# Patient Record
Sex: Male | Born: 1970
Health system: Southern US, Community
[De-identification: ages and names within clinical notes are randomized; demographics above are authoritative.]

## PROBLEM LIST (undated history)

## (undated) DIAGNOSIS — I82409 Acute embolism and thrombosis of unspecified deep veins of unspecified lower extremity: Secondary | ICD-10-CM

## (undated) DIAGNOSIS — R0602 Shortness of breath: Secondary | ICD-10-CM

## (undated) HISTORY — DX: Shortness of breath: R06.02

## (undated) HISTORY — PX: NO PAST SURGERIES: SHX2092

---

## 2000-11-07 ENCOUNTER — Encounter: Admission: RE | Admit: 2000-11-07 | Discharge: 2000-11-07 | Payer: Self-pay | Admitting: General Practice

## 2000-11-07 ENCOUNTER — Encounter: Payer: Self-pay | Admitting: General Practice

## 2009-12-27 ENCOUNTER — Emergency Department (HOSPITAL_COMMUNITY): Admission: EM | Admit: 2009-12-27 | Discharge: 2009-12-27 | Payer: Self-pay | Admitting: Emergency Medicine

## 2010-01-03 ENCOUNTER — Ambulatory Visit: Payer: Self-pay | Admitting: Oncology

## 2010-01-06 LAB — CBC WITH DIFFERENTIAL/PLATELET
BASO%: 0.6 % (ref 0.0–2.0)
Basophils Absolute: 0 10*3/uL (ref 0.0–0.1)
EOS%: 4.4 % (ref 0.0–7.0)
Eosinophils Absolute: 0.2 10*3/uL (ref 0.0–0.5)
HCT: 42.1 % (ref 38.4–49.9)
HGB: 14.1 g/dL (ref 13.0–17.1)
LYMPH%: 44.4 % (ref 14.0–49.0)
MCH: 27.5 pg (ref 27.2–33.4)
MCHC: 33.5 g/dL (ref 32.0–36.0)
MCV: 82.3 fL (ref 79.3–98.0)
MONO#: 0.6 10*3/uL (ref 0.1–0.9)
MONO%: 9.8 % (ref 0.0–14.0)
NEUT#: 2.3 10*3/uL (ref 1.5–6.5)
NEUT%: 40.8 % (ref 39.0–75.0)
Platelets: 231 10*3/uL (ref 140–400)
RBC: 5.11 10*6/uL (ref 4.20–5.82)
RDW: 12.8 % (ref 11.0–14.6)
WBC: 5.7 10*3/uL (ref 4.0–10.3)
lymph#: 2.5 10*3/uL (ref 0.9–3.3)

## 2010-01-08 LAB — LUPUS ANTICOAGULANT PANEL
DRVVT 1:1 Mix: 42.3 secs (ref 36.2–44.3)
DRVVT: 55.9 secs — ABNORMAL HIGH (ref 36.2–44.3)
Lupus Anticoagulant: NOT DETECTED
PTT Lupus Anticoagulant: 58.8 secs — ABNORMAL HIGH (ref 32.0–43.4)
PTTLA 4:1 Mix: 47.9 secs (ref 36.3–48.8)
PTTLA Confirmation: 0.7 secs (ref <8.0)

## 2010-01-08 LAB — PROTHROMBIN TIME
INR: 1.83 — ABNORMAL HIGH (ref <1.50)
Prothrombin Time: 21 seconds — ABNORMAL HIGH (ref 11.6–15.2)

## 2010-01-08 LAB — COMPREHENSIVE METABOLIC PANEL
ALT: 59 U/L — ABNORMAL HIGH (ref 0–53)
AST: 38 U/L — ABNORMAL HIGH (ref 0–37)
Albumin: 4.3 g/dL (ref 3.5–5.2)
Alkaline Phosphatase: 52 U/L (ref 39–117)
BUN: 16 mg/dL (ref 6–23)
CO2: 23 mEq/L (ref 19–32)
Calcium: 9.2 mg/dL (ref 8.4–10.5)
Chloride: 103 mEq/L (ref 96–112)
Creatinine, Ser: 1.08 mg/dL (ref 0.40–1.50)
Glucose, Bld: 87 mg/dL (ref 70–99)
Potassium: 3.9 mEq/L (ref 3.5–5.3)
Sodium: 137 mEq/L (ref 135–145)
Total Bilirubin: 0.3 mg/dL (ref 0.3–1.2)
Total Protein: 7.1 g/dL (ref 6.0–8.3)

## 2010-01-08 LAB — PROTHROMBIN GENE MUTATION

## 2010-01-08 LAB — D-DIMER, QUANTITATIVE: D-Dimer, Quant: 1.48 ug/mL-FEU — ABNORMAL HIGH (ref 0.00–0.48)

## 2010-01-08 LAB — FACTOR 5 LEIDEN

## 2010-01-08 LAB — LACTATE DEHYDROGENASE: LDH: 179 U/L (ref 94–250)

## 2010-03-10 ENCOUNTER — Ambulatory Visit: Payer: Self-pay | Admitting: Oncology

## 2010-03-14 LAB — SEDIMENTATION RATE: Sed Rate: 8 mm/hr (ref 0–16)

## 2010-03-14 LAB — PROTIME-INR
INR: 2 (ref 2.00–3.50)
Protime: 24 Seconds — ABNORMAL HIGH (ref 10.6–13.4)

## 2010-07-08 ENCOUNTER — Ambulatory Visit: Payer: Self-pay | Admitting: Oncology

## 2010-07-12 LAB — CBC WITH DIFFERENTIAL/PLATELET
BASO%: 0.5 % (ref 0.0–2.0)
EOS%: 3.9 % (ref 0.0–7.0)
Eosinophils Absolute: 0.2 10*3/uL (ref 0.0–0.5)
MCH: 26.5 pg — ABNORMAL LOW (ref 27.2–33.4)
MCHC: 33.1 g/dL (ref 32.0–36.0)
MCV: 80.1 fL (ref 79.3–98.0)
MONO%: 7.4 % (ref 0.0–14.0)
NEUT#: 2.7 10*3/uL (ref 1.5–6.5)
RBC: 5.32 10*6/uL (ref 4.20–5.82)
RDW: 13.6 % (ref 11.0–14.6)
nRBC: 0 % (ref 0–0)

## 2010-07-13 LAB — COMPREHENSIVE METABOLIC PANEL
ALT: 24 U/L (ref 0–53)
AST: 16 U/L (ref 0–37)
Alkaline Phosphatase: 58 U/L (ref 39–117)
Creatinine, Ser: 0.94 mg/dL (ref 0.40–1.50)
Sodium: 141 mEq/L (ref 135–145)
Total Bilirubin: 0.3 mg/dL (ref 0.3–1.2)
Total Protein: 7 g/dL (ref 6.0–8.3)

## 2010-07-13 LAB — PROTHROMBIN TIME: INR: 2.17 — ABNORMAL HIGH (ref <1.50)

## 2010-09-07 ENCOUNTER — Ambulatory Visit: Payer: Self-pay | Admitting: Oncology

## 2010-09-09 ENCOUNTER — Ambulatory Visit
Admission: RE | Admit: 2010-09-09 | Discharge: 2010-09-09 | Payer: Self-pay | Source: Home / Self Care | Attending: Oncology | Admitting: Oncology

## 2010-10-07 ENCOUNTER — Other Ambulatory Visit (HOSPITAL_COMMUNITY): Payer: Self-pay | Admitting: Oncology

## 2010-10-07 ENCOUNTER — Encounter (HOSPITAL_BASED_OUTPATIENT_CLINIC_OR_DEPARTMENT_OTHER): Payer: PRIVATE HEALTH INSURANCE | Admitting: Oncology

## 2010-10-07 DIAGNOSIS — Z86718 Personal history of other venous thrombosis and embolism: Secondary | ICD-10-CM

## 2010-10-07 DIAGNOSIS — Z7901 Long term (current) use of anticoagulants: Secondary | ICD-10-CM

## 2010-10-12 LAB — PROTEIN S ACTIVITY: Protein S Activity: 97 % (ref 69–129)

## 2010-10-12 LAB — PROTEIN C ACTIVITY: Protein C Activity: 122 % (ref 75–133)

## 2010-10-12 LAB — PROTEIN S, ANTIGEN, FREE: Protein S Ag, Free: 84 % normal (ref 57–171)

## 2010-10-12 LAB — PROTEIN C, TOTAL: Protein C, Total: 83 % (ref 70–140)

## 2010-10-12 LAB — LUPUS ANTICOAGULANT PANEL
Lupus Anticoagulant: NOT DETECTED
PTT Lupus Anticoagulant: 31.1 secs (ref 30.0–45.6)

## 2010-10-12 LAB — ANTITHROMBIN III: AntiThromb III Func: 103 % (ref 76–126)

## 2011-02-27 ENCOUNTER — Encounter (HOSPITAL_BASED_OUTPATIENT_CLINIC_OR_DEPARTMENT_OTHER): Payer: PRIVATE HEALTH INSURANCE | Admitting: Oncology

## 2011-02-27 ENCOUNTER — Other Ambulatory Visit (HOSPITAL_COMMUNITY): Payer: Self-pay | Admitting: Oncology

## 2011-02-27 DIAGNOSIS — Z7901 Long term (current) use of anticoagulants: Secondary | ICD-10-CM

## 2011-02-27 DIAGNOSIS — Z86718 Personal history of other venous thrombosis and embolism: Secondary | ICD-10-CM

## 2011-02-28 LAB — D-DIMER, QUANTITATIVE: D-Dimer, Quant: 0.38 ug/mL-FEU (ref 0.00–0.48)

## 2012-01-29 ENCOUNTER — Telehealth: Payer: Self-pay

## 2012-01-29 NOTE — Telephone Encounter (Signed)
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 He quit last Monday 6/10 and came home to [facility]. He denies redness or swelling. He has hx of unprovoked DVT. He was last seen here on 02/27/11 with no further appts ordered. I told him to go to his PCP or urgent care today to get this checked out. He expressed understanding.

## 2012-03-02 ENCOUNTER — Emergency Department (HOSPITAL_COMMUNITY)
Admission: EM | Admit: 2012-03-02 | Discharge: 2012-03-02 | Disposition: A | Discharge status: Home / Self Care | Payer: Self-pay | Attending: Emergency Medicine | Admitting: Emergency Medicine

## 2012-03-02 ENCOUNTER — Encounter (HOSPITAL_COMMUNITY): Payer: Self-pay | Admitting: Emergency Medicine

## 2012-03-02 DIAGNOSIS — M79609 Pain in unspecified limb: Secondary | ICD-10-CM

## 2012-03-02 DIAGNOSIS — M79606 Pain in leg, unspecified: Secondary | ICD-10-CM

## 2012-03-02 DIAGNOSIS — Z86718 Personal history of other venous thrombosis and embolism: Secondary | ICD-10-CM | POA: Insufficient documentation

## 2012-03-02 DIAGNOSIS — M7989 Other specified soft tissue disorders: Secondary | ICD-10-CM

## 2012-03-02 HISTORY — DX: Acute embolism and thrombosis of unspecified deep veins of unspecified lower extremity: I82.409

## 2012-03-02 NOTE — Progress Notes (Signed)
VASCULAR LAB PRELIMINARY  PRELIMINARY  PRELIMINARY  PRELIMINARY  Bilateral lower extremity venous Dopplers completed.    Preliminary report:  There is no DVT or SVT noted in the bilateral lower extremities.  Starlina Lapre, 03/02/2012, 2:29 PM

## 2012-03-02 NOTE — ED Provider Notes (Signed)
History     CSN: [id number]  Arrival date & time 03/02/12  1143   First MD Initiated Contact with Patient 03/02/12 1229      12:43 PM HPI Pt reports for the last week has had intermittent calf pain. Reports h/o DVT and long road trips. Is not on anticoagulants any longer. Denies CP or SOB.   Patient is a 41 y.o. male presenting with leg pain.  Leg Pain  The incident occurred more than 1 week ago. The injury mechanism is unknown. The pain is present in the left leg. The quality of the pain is described as aching. The pain is moderate. The pain has been constant since onset. Pertinent negatives include no numbness, no inability to bear weight, no loss of motion, no muscle weakness, no loss of sensation and no tingling. He reports no foreign bodies present. The symptoms are aggravated by activity. He has tried rest for the symptoms. The treatment provided no relief.    Past Medical History  Diagnosis Date  . DVT (deep venous thrombosis)     History reviewed. No pertinent past surgical history.  History reviewed. No pertinent family history.  History  Substance Use Topics  . Smoking status: Never Smoker   . Smokeless tobacco: Not on file  . Alcohol Use: No      Review of Systems  Musculoskeletal:       Leg pain   Neurological: Negative for tingling and numbness.  All other systems reviewed and are negative.    Allergies  Aspirin  Home Medications   Current Outpatient Rx  Name Route Sig Dispense Refill  . ADULT MULTIVITAMIN W/MINERALS CH Oral Take 1 tablet by mouth daily.      BP 144/79  Pulse 66  Temp 98 F (36.7 C) (Oral)  Resp 18  SpO2 98%  Physical Exam  Constitutional: He is oriented to person, place, and time. He appears well-developed and well-nourished.  HENT:  Head: Normocephalic and atraumatic.  Eyes: Conjunctivae are normal. Pupils are equal, round, and reactive to light.  Neck: Normal range of motion. Neck supple.  Cardiovascular: Normal rate,  regular rhythm and normal heart sounds.   Pulmonary/Chest: Effort normal and breath sounds normal.  Abdominal: Soft. Bowel sounds are normal.  Musculoskeletal:       Normal exam of bilateral lower extremities. Normal range of motion, normal sensation, nontender to palpation, no masses records, normal distal pulses and capillary refill  Neurological: He is alert and oriented to person, place, and time.  Skin: Skin is warm and dry. No rash noted. No erythema. No pallor.  Psychiatric: He has a normal mood and affect. His behavior is normal. Thought content normal.    ED Course  Procedures    MDM   Dopplers were negative for DVT. Advised patient and muscle pain versus circulation pain. 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 Patient voices understanding and is ready for discharge    Thomasene Lot, Cordelia Poche 03/02/12 [id number]

## 2012-03-02 NOTE — ED Notes (Signed)
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  Pt states pain has not decreased over the past month.  Pt states pain has gotten to the point that it is hard to walk.  Pt has hx of DVT in the L leg in 2011, and was treated for 6 months.  Pt able to move legs, pedal pulse strong.

## 2012-03-02 NOTE — ED Notes (Signed)
Duplex completed

## 2012-03-02 NOTE — ED Provider Notes (Signed)
Medical screening examination/treatment/procedure(s) were performed by non-physician practitioner and as supervising physician I was immediately available for consultation/collaboration.  Derwood Kaplan, MD 03/02/12 1747

## 2014-06-28 DIAGNOSIS — I10 Essential (primary) hypertension: Secondary | ICD-10-CM | POA: Insufficient documentation

## 2016-02-11 ENCOUNTER — Emergency Department (HOSPITAL_COMMUNITY)
Admission: EM | Admit: 2016-02-11 | Discharge: 2016-02-11 | Disposition: A | Discharge status: Home / Self Care | Payer: 59 | Attending: Emergency Medicine | Admitting: Emergency Medicine

## 2016-02-11 ENCOUNTER — Encounter (HOSPITAL_COMMUNITY): Payer: Self-pay

## 2016-02-11 ENCOUNTER — Emergency Department (HOSPITAL_BASED_OUTPATIENT_CLINIC_OR_DEPARTMENT_OTHER)
Admit: 2016-02-11 | Discharge: 2016-02-11 | Disposition: A | Discharge status: Home / Self Care | Payer: 59 | Attending: Emergency Medicine | Admitting: Emergency Medicine

## 2016-02-11 ENCOUNTER — Telehealth: Payer: Self-pay | Admitting: *Deleted

## 2016-02-11 ENCOUNTER — Telehealth (HOSPITAL_COMMUNITY): Payer: Self-pay

## 2016-02-11 DIAGNOSIS — Z79899 Other long term (current) drug therapy: Secondary | ICD-10-CM | POA: Diagnosis not present

## 2016-02-11 DIAGNOSIS — M79661 Pain in right lower leg: Secondary | ICD-10-CM | POA: Diagnosis present

## 2016-02-11 DIAGNOSIS — I82401 Acute embolism and thrombosis of unspecified deep veins of right lower extremity: Secondary | ICD-10-CM | POA: Insufficient documentation

## 2016-02-11 DIAGNOSIS — M79609 Pain in unspecified limb: Secondary | ICD-10-CM

## 2016-02-11 LAB — BASIC METABOLIC PANEL
Anion gap: 7 (ref 5–15)
BUN: 11 mg/dL (ref 6–20)
CALCIUM: 9.1 mg/dL (ref 8.9–10.3)
CO2: 26 mmol/L (ref 22–32)
CREATININE: 0.94 mg/dL (ref 0.61–1.24)
Chloride: 105 mmol/L (ref 101–111)
GFR calc Af Amer: >60 mL/min (ref >60)
Glucose, Bld: 113 mg/dL — ABNORMAL HIGH (ref 65–99)
POTASSIUM: 4 mmol/L (ref 3.5–5.1)
SODIUM: 138 mmol/L (ref 135–145)

## 2016-02-11 MED ORDER — RIVAROXABAN 15 MG PO TABS
15.0000 mg | ORAL_TABLET | Freq: Once | ORAL | Status: AC
  Administered 2016-02-11: 15 mg via ORAL
  Filled 2016-02-11: qty 1

## 2016-02-11 MED ORDER — RIVAROXABAN (XARELTO) VTE STARTER PACK (15 & 20 MG): 15.0000 mg | ORAL_TABLET | Freq: Two times a day (BID) | ORAL | Status: DC

## 2016-02-11 MED ORDER — RIVAROXABAN (XARELTO) EDUCATION KIT FOR DVT/PE PATIENTS
PACK | Freq: Once | Status: AC
Start: 2016-02-11 — End: 2016-02-11
  Administered 2016-02-11: 13:00:00
  Filled 2016-02-11: qty 1

## 2016-02-11 NOTE — Progress Notes (Signed)
VASCULAR LAB PRELIMINARY  PRELIMINARY  PRELIMINARY  PRELIMINARY  Bilateral lower extremity venous duplex completed.    Preliminary report:  Bilateral exam completed per protocol in patient with acute DVT noted in the requested extremity. Positive for acute DVT of the right gastrocnemius vein coursing from the mid calf to the confluence with but not including the popliteal vein. No evidence of a superficial thrombosis or Baker's cyst involving the right lower extremity. Left:  No evidence of DVT, superficial thrombosis, or Baker's cyst.  Magnum Lunde, RVS 02/11/2016, 10:35 AM

## 2016-02-11 NOTE — Discharge Instructions (Signed)
Return without fail for worsening symptoms, including worsening swelling or pain, chest pain, difficulty breathing, passing out, or any other symptoms concerning to you.  Deep Vein Thrombosis A deep vein thrombosis (DVT) is a blood clot (thrombus) that usually occurs in a deep, larger vein of the lower leg or the pelvis, or in an upper extremity such as the arm. These are dangerous and can lead to serious and even life-threatening complications if the clot travels to the lungs. A DVT can damage the valves in your leg veins so that instead of flowing upward, the blood pools in the lower leg. This is called post-thrombotic syndrome, and it can result in pain, swelling, discoloration, and sores on the leg. CAUSES A DVT is caused by the formation of a blood clot in your leg, pelvis, or arm. Usually, several things contribute to the formation of blood clots. A clot may develop when:  Your blood flow slows down.  Your vein becomes damaged in some way.  You have a condition that makes your blood clot more easily. RISK FACTORS A DVT is more likely to develop in:  People who are older, especially over 67 years of age.  People who are overweight (obese).  People who sit or lie still for a long time, such as during long-distance travel (over 4 hours), bed rest, hospitalization, or during recovery from certain medical conditions like a stroke.  People who do not engage in much physical activity (sedentary lifestyle).  People who have chronic breathing disorders.  People who have a personal or family history of blood clots or blood clotting disease.  People who have peripheral vascular disease (PVD), diabetes, or some types of cancer.  People who have heart disease, especially if the person had a recent heart attack or has congestive heart failure.  People who have neurological diseases that affect the legs (leg paresis).  People who have had a traumatic injury, such as breaking a hip or  leg.  People who have recently had major or lengthy surgery, especially on the hip, knee, or abdomen.  People who have had a central line placed inside a large vein.  People who take medicines that contain the hormone estrogen. These include birth control pills and hormone replacement therapy.  Pregnancy or during childbirth or the postpartum period.  Long plane flights (over 8 hours). SIGNS AND SYMPTOMS Symptoms of a DVT can include:   Swelling of your leg or arm, especially if one side is much worse.  Warmth and redness of your leg or arm, especially if one side is much worse.  Pain in your arm or leg. If the clot is in your leg, symptoms may be more noticeable or worse when you stand or walk.  A feeling of pins and needles, if the clot is in the arm. The symptoms of a DVT that has traveled to the lungs (pulmonary embolism, PE) usually start suddenly and include:  Shortness of breath while active or at rest.  Coughing or coughing up blood or blood-tinged mucus.  Chest pain that is often worse with deep breaths.  Rapid or irregular heartbeat.  Feeling light-headed or dizzy.  Fainting.  Feeling anxious.  Sweating. There may also be pain and swelling in a leg if that is where the blood clot started. These symptoms may represent a serious problem that is an emergency. Do not wait to see if the symptoms will go away. Get medical help right away. Call your local emergency services (911 in the [place]  Do not drive yourself to the hospital. DIAGNOSIS Your health care provider will take a medical history and perform a physical exam. You may also have other tests, including:  Blood tests to assess the clotting properties of your blood.  Imaging tests, such as CT, ultrasound, MRI, X-ray, and other tests to see if you have clots anywhere in your body. TREATMENT After a DVT is identified, it can be treated. The type of treatment that you receive depends on many factors, such as the  cause of your DVT, your risk for bleeding or developing more clots, and other medical conditions that you have. Sometimes, a combination of treatments is necessary. Treatment options may be combined and include:  Monitoring the blood clot with ultrasound.  Taking medicines by mouth, such as newer blood thinners (anticoagulants), thrombolytics, or warfarin.  Taking anticoagulant medicine by injection or through an IV tube.  Wearing compression stockings or using different types ofdevices.  Surgery (rare) to remove the blood clot or to place a filter in your abdomen to stop the blood clot from traveling to your lungs. Treatments for a DVT are often divided into immediate treatment and long-term treatment (up to 3 months after DVT). You can work with your health care provider to choose the treatment program that is best for you. HOME CARE INSTRUCTIONS If you are taking a newer oral anticoagulant:  Take the medicine every single day at the same time each day.  Understand what foods and drugs interact with this medicine.  Understand that there are no regular blood tests required when using this medicine.  Understand the side effects of this medicine, including excessive bruising or bleeding. Ask your health care provider or pharmacist about other possible side effects. If you are taking warfarin:  Understand how to take warfarin and know which foods can affect how warfarin works in Veterinary surgeon.  Understand that it is dangerous to take too much or too little warfarin. Too much warfarin increases the risk of bleeding. Too little warfarin continues to allow the risk for blood clots.  Follow your PT and INR blood testing schedule. The PT and INR results allow your health care provider to adjust your dose of warfarin. It is very important that you have your PT and INR tested as often as told by your health care provider.  Avoid major changes in your diet, or tell your health care provider before  you change your diet. Arrange a visit with a registered dietitian to answer your questions. Many foods, especially foods that are high in vitamin K, can interfere with warfarin and affect the PT and INR results. Eat a consistent amount of foods that are high in vitamin K, such as:  Spinach, kale, broccoli, cabbage, collard greens, turnip greens, Brussels sprouts, peas, cauliflower, seaweed, and parsley.  Beef liver and pork liver.  Green tea.  Soybean oil.  Tell your health care provider about any and all medicines, vitamins, and supplements that you take, including aspirin and other over-the-counter anti-inflammatory medicines. Be especially cautious with aspirin and anti-inflammatory medicines. Do not take those before you ask your health care provider if it is safe to do so. This is important because many medicines can interfere with warfarin and affect the PT and INR results.  Do not start or stop taking any over-the-counter or prescription medicine unless your health care provider or pharmacist tells you to do so. If you take warfarin, you will also need to do these things:  Hold pressure over cuts  for longer than usual.  Tell your dentist and other health care providers that you are taking warfarin before you have any procedures in which bleeding may occur.  Avoid alcohol or drink very small amounts. Tell your health care provider if you change your alcohol intake.  Do not use tobacco products, including cigarettes, chewing tobacco, and e-cigarettes. If you need help quitting, ask your health care provider.  Avoid contact sports. General Instructions  Take over-the-counter and prescription medicines only as told by your health care provider. Anticoagulant medicines can have side effects, including easy bruising and difficulty stopping bleeding. If you are prescribed an anticoagulant, you will also need to do these things:  Hold pressure over cuts for longer than usual.  Tell your  dentist and other health care providers that you are taking anticoagulants before you have any procedures in which bleeding may occur.  Avoid contact sports.  Wear a medical alert bracelet or carry a medical alert card that says you have had a PE.  Ask your health care provider how soon you can go back to your normal activities. Stay active to prevent new blood clots from forming.  Make sure to exercise while traveling or when you have been sitting or standing for a long period of time. It is very important to exercise. Exercise your legs by walking or by tightening and relaxing your leg muscles often. Take frequent walks.  Wear compression stockings as told by your health care provider to help prevent more blood clots from forming.  Do not use tobacco products, including cigarettes, chewing tobacco, and e-cigarettes. If you need help quitting, ask your health care provider.  Keep all follow-up appointments with your health care provider. This is important. PREVENTION Take these actions to decrease your risk of developing another DVT:  Exercise regularly. For at least 30 minutes every day, engage in:  Activity that involves moving your arms and legs.  Activity that encourages good blood flow through your body by increasing your heart rate.  Exercise your arms and legs every hour during long-distance travel (over 4 hours). Drink plenty of water and avoid drinking alcohol while traveling.  Avoid sitting or lying in bed for long periods of time without moving your legs.  Maintain a weight that is appropriate for your height. Ask your health care provider what weight is healthy for you.  If you are a woman who is over 45 years of age, avoid unnecessary use of medicines that contain estrogen. These include birth control pills.  Do not smoke, especially if you take estrogen medicines. If you need help quitting, ask your health care provider. If you are hospitalized, prevention measures may  include:  Early walking after surgery, as soon as your health care provider says that it is safe.  Receiving anticoagulants to prevent blood clots.If you cannot take anticoagulants, other options may be available, such as wearing compression stockings or using different types of devices. SEEK IMMEDIATE MEDICAL CARE IF:  You have new or increased pain, swelling, or redness in an arm or leg.  You have numbness or tingling in an arm or leg.  You have shortness of breath while active or at rest.  You have chest pain.  You have a rapid or irregular heartbeat.  You feel light-headed or dizzy.  You cough up blood.  You notice blood in your vomit, bowel movement, or urine. These symptoms may represent a serious problem that is an emergency. Do not wait to see if the symptoms  will go away. Get medical help right away. Call your local emergency services (911 in the [place] Do not drive yourself to the hospital.   This information is not intended to replace advice given to you by your health care provider. Make sure you discuss any questions you have with your health care provider.   Document Released: 07/31/2005 Document Revised: 04/21/2015 Document Reviewed: 11/25/2014 Elsevier Interactive Patient Education 2016 [place] on my medicine - XARELTO (rivaroxaban)  This medication education was reviewed with me or my healthcare representative as part of my discharge preparation.  The pharmacist that spoke with me during my hospital stay was:  Cadey Bazile A, [place]? Xarelto was prescribed to treat blood clots that may have been found in the veins of your legs (deep vein thrombosis) or in your lungs (pulmonary embolism) and to reduce the risk of them occurring again.  What do you need to know about Xarelto? The starting dose is one 15 mg tablet taken TWICE daily with food for the FIRST 21 DAYS then on (enter date)  03/03/2016  the dose is  changed to one 20 mg tablet taken ONCE A DAY with your evening meal.  DO NOT stop taking Xarelto without talking to the health care provider who prescribed the medication.  Refill your prescription for 20 mg tablets before you run out.  After discharge, you should have regular check-up appointments with your healthcare provider that is prescribing your Xarelto.  In the future your dose may need to be changed if your kidney function changes by a significant amount.  What do you do if you miss a dose? If you are taking Xarelto TWICE DAILY and you miss a dose, take it as soon as you remember. You may take two 15 mg tablets (total 30 mg) at the same time then resume your regularly scheduled 15 mg twice daily the next day.  If you are taking Xarelto ONCE DAILY and you miss a dose, take it as soon as you remember on the same day then continue your regularly scheduled once daily regimen the next day. Do not take two doses of Xarelto at the same time.   Important Safety Information Xarelto is a blood thinner medicine that can cause bleeding. You should call your healthcare provider right away if you experience any of the following: ? Bleeding from an injury or your nose that does not stop. ? Unusual colored urine (red or dark brown) or unusual colored stools (red or black). ? Unusual bruising for unknown reasons. ? A serious fall or if you hit your head (even if there is no bleeding).  Some medicines may interact with Xarelto and might increase your risk of bleeding while on Xarelto. To help avoid this, consult your healthcare provider or pharmacist prior to using any new prescription or non-prescription medications, including herbals, vitamins, non-steroidal anti-inflammatory drugs (NSAIDs) and supplements.  This website has more information on Xarelto: [email or web address]

## 2016-02-11 NOTE — ED Provider Notes (Signed)
CSN: [id number]     Arrival date & time 02/11/16  0755 History   First MD Initiated Contact with Patient 02/11/16 [phone]     Chief Complaint  Patient presents with  . Leg Pain     (Consider location/radiation/quality/duration/timing/severity/associated sxs/prior Treatment) HPI 45 year old male who presents with right leg pain. He has a history of DVT in 2011, no longer on anticoagulation. Develop his DVT in the setting of immobilization. States that last week he drove to and from [place], stopping frequently along the way to walk around. Since return from [place] over the past 4 days he is noted right calf pain, posterior knee pain, and posterior thigh pain. States that he initially noticed this after walking on the treadmill. He did not have any fall or trauma. Denies any associating numbness or weakness. Does not appreciate swelling or skin color changes of his lower extremity. No chest pain or difficulty breathing. Past Medical History  Diagnosis Date  . DVT (deep venous thrombosis) ([place])    History reviewed. No pertinent past surgical history. History reviewed. No pertinent family history. Social History  Substance Use Topics  . Smoking status: Never Smoker   . Smokeless tobacco: None  . Alcohol Use: Yes     Comment: occ    Review of Systems 10/14 systems reviewed and are negative other than those stated in the HPI    Allergies  Aspirin  Home Medications   Prior to Admission medications   Medication Sig Start Date End Date Taking? Authorizing Provider  Multiple Vitamin (MULTIVITAMIN WITH MINERALS) TABS Take 1 tablet by mouth daily.   Yes Historical Provider, MD  Omega-3 Fatty Acids (FISH OIL) 1000 MG CAPS Take 1,000 mg by mouth daily.   Yes Historical Provider, MD  Rivaroxaban 15 & 20 MG TBPK Take 15 mg by mouth 2 (two) times daily. Take as directed on package: Start with one 15mg  tablet by mouth twice a day with food. On Day 22, switch to one 20mg  tablet once a day with  food. 02/11/16   Forde Dandy, MD   BP 133/75 mmHg  Pulse 67  Temp(Src) 98.1 F (36.7 C) (Oral)  Resp 16  SpO2 99% Physical Exam Physical Exam  Nursing note and vitals reviewed. Constitutional: Well developed, well nourished, non-toxic, and in no acute distress Head: Normocephalic and atraumatic.  Mouth/Throat: Oropharynx is clear and moist.  Neck: Normal range of motion. Neck supple.  Cardiovascular: +2 DP pulses bilaterally Pulmonary/Chest: Effort normal and breath sounds normal.  Abdominal: Soft. There is no tenderness. There is no rebound and no guarding.  Musculoskeletal: Normal range of motion. No edema in lower extremities. No deformities. No overlying skin changes. There is mild tenderness to palpation of the posterior right thigh and posterior calf.  Neurological: Alert, no facial droop, fluent speech, full strength in bilateral lower extremities, sensation to light touch in tact throughout bilateral lower extremities Skin: Skin is warm and dry.  Psychiatric: Cooperative  ED Course  Procedures (including critical care time) Labs Review Labs Reviewed  BASIC METABOLIC PANEL - Abnormal; Notable for the following:    Glucose, Bld 113 (*)    All other components within normal limits    Imaging Review No results found. I have personally reviewed and evaluated these images and lab results as part of my medical decision-making.   EKG Interpretation None      MDM   Final diagnoses:  DVT (deep venous thrombosis), right    With right calf tenderness over  past 4 days. [place] LE with acute gastrocnemius DVT without extension into proximal veins. Would be eligible for repeat US rather than anticoagulation given distal DVT but he is without PCP and there is concern for loss to follow-up and he is symptomatic from DVT. No concern for PE. I felt that initiating Xarelto would be best option for him at this time while he is looking for a primary care provider to follow-up with. No  further extension his primary care doctor on follow up may discontinue anticoagulation is needed. He has normal renal function, given first dose here and teaching provided by pharmacy. Strict return and follow-up instructions are reviewed. He expressed understanding of discharge instructions, and felt comfortable with the plan of care.    Forde Dandy, MD 02/11/16 [phone]

## 2016-02-11 NOTE — Telephone Encounter (Signed)
Call from pharmacy pts insurance not covering [place] call transferred to prescriber Dr Tyrone Schimke for assistance

## 2016-02-11 NOTE — ED Notes (Addendum)
Pt c/o R calf pain radiating into R posterior thigh x 4 days.  Pain score 7/10.  Pt has not been taking anything for pain.  Denies injury.  Pt reports that he was doing "nothing special" when pain began.  Pt reports being seen by an Urgent care yesterday, but was not given a diagnosis.   Hx of DVT in L leg.  No swelling, warmth, or discoloration noted.

## 2016-02-11 NOTE — Progress Notes (Signed)
ED CM noted CM consult for pt -? Reason Cm noted pt without a pcp nor insurance coverage listed CM spoke with pt who states he has [place] and was previously seen by a MD on "[place]" "[facility].."  Pt then informed CM he was present searching his smartphone to see if he could find another provider from the list CM discussed with him that this would be recommended to prevent him from seeing an out of network provider and receiving a bill Pt voiced agreement  Cm offered and provided pt with a list of in network providers for [employer] in his zip coded and asked ED Registration to return to pt room to register his [place] ED CM spoke with EDP, Viviana Simpler who states referral was to get pt connected with a new pcp of his choice to follow him for the newly found dvt Discussed with her also that an order for pharmacy consult can be entered in [facility] to assist with starter pack and education on DVT medication for pt EDP to enter orders

## 2016-03-02 ENCOUNTER — Emergency Department (HOSPITAL_COMMUNITY)
Admission: EM | Admit: 2016-03-02 | Discharge: 2016-03-02 | Disposition: A | Discharge status: Home / Self Care | Payer: 59 | Attending: Emergency Medicine | Admitting: Emergency Medicine

## 2016-03-02 ENCOUNTER — Encounter (HOSPITAL_COMMUNITY): Payer: Self-pay

## 2016-03-02 DIAGNOSIS — K625 Hemorrhage of anus and rectum: Secondary | ICD-10-CM | POA: Insufficient documentation

## 2016-03-02 DIAGNOSIS — Z79899 Other long term (current) drug therapy: Secondary | ICD-10-CM | POA: Diagnosis not present

## 2016-03-02 DIAGNOSIS — Z79891 Long term (current) use of opiate analgesic: Secondary | ICD-10-CM | POA: Insufficient documentation

## 2016-03-02 LAB — CBC WITH DIFFERENTIAL/PLATELET
BASOS ABS: 0 10*3/uL (ref 0.0–0.1)
Basophils Relative: 1 %
EOS ABS: 0.3 10*3/uL (ref 0.0–0.7)
EOS PCT: 6 %
HCT: 43.1 % (ref 39.0–52.0)
Hemoglobin: 14.3 g/dL (ref 13.0–17.0)
Lymphocytes Relative: 42 %
Lymphs Abs: 2.3 10*3/uL (ref 0.7–4.0)
MCH: 26.8 pg (ref 26.0–34.0)
MCHC: 33.2 g/dL (ref 30.0–36.0)
MCV: 80.7 fL (ref 78.0–100.0)
Monocytes Absolute: 0.5 10*3/uL (ref 0.1–1.0)
Monocytes Relative: 10 %
NEUTROS PCT: 41 %
Neutro Abs: 2.2 10*3/uL (ref 1.7–7.7)
PLATELETS: 233 10*3/uL (ref 150–400)
RBC: 5.34 MIL/uL (ref 4.22–5.81)
RDW: 13 % (ref 11.5–15.5)
WBC: 5.5 10*3/uL (ref 4.0–10.5)

## 2016-03-02 LAB — BASIC METABOLIC PANEL
Anion gap: 6 (ref 5–15)
BUN: 18 mg/dL (ref 6–20)
CO2: 26 mmol/L (ref 22–32)
CREATININE: 1.04 mg/dL (ref 0.61–1.24)
Calcium: 9 mg/dL (ref 8.9–10.3)
Chloride: 106 mmol/L (ref 101–111)
Glucose, Bld: 118 mg/dL — ABNORMAL HIGH (ref 65–99)
POTASSIUM: 3.9 mmol/L (ref 3.5–5.1)
SODIUM: 138 mmol/L (ref 135–145)

## 2016-03-02 LAB — PROTIME-INR
INR: 1.95 — AB (ref 0.00–1.49)
PROTHROMBIN TIME: 22.1 s — AB (ref 11.6–15.2)

## 2016-03-02 LAB — POC OCCULT BLOOD, ED: FECAL OCCULT BLD: NEGATIVE

## 2016-03-02 MED ORDER — HYDROCORTISONE 2.5 % RE CREA: TOPICAL_CREAM | RECTAL | Status: DC

## 2016-03-02 MED ORDER — DOCUSATE SODIUM 100 MG PO CAPS: 100.0000 mg | ORAL_CAPSULE | Freq: Two times a day (BID) | ORAL | Status: DC

## 2016-03-02 MED ORDER — SODIUM CHLORIDE 0.9 % IV BOLUS (SEPSIS)
1000.0000 mL | Freq: Once | INTRAVENOUS | Status: AC
  Administered 2016-03-02: 1000 mL via INTRAVENOUS

## 2016-03-02 NOTE — Discharge Instructions (Signed)
Gastrointestinal Bleeding °Gastrointestinal bleeding is bleeding somewhere along the path that food travels through the body (digestive tract). This path is anywhere between the mouth and the opening of the butt (anus). You may have blood in your throw up (vomit) or in your poop (stools). If there is a lot of bleeding, you may need to stay in the hospital. °HOME CARE °· Only take medicine as told by your doctor. °· Eat foods with fiber such as whole grains, fruits, and vegetables. You can also try eating 1 to 3 prunes a day. °· Drink enough fluids to keep your pee (urine) clear or pale yellow. °GET HELP RIGHT AWAY IF:  °· Your bleeding gets worse. °· You feel dizzy, weak, or you pass out (faint). °· You have bad cramps in your back or belly (abdomen). °· You have large blood clumps (clots) in your poop. °· Your problems are getting worse. °MAKE SURE YOU:  °· Understand these instructions. °· Will watch your condition. °· Will get help right away if you are not doing well or get worse. °  °This information is not intended to replace advice given to you by your health care provider. Make sure you discuss any questions you have with your health care provider. °  °Document Released: 05/09/2008 Document Revised: 07/17/2012 Document Reviewed: 01/18/2015 °Elsevier Interactive Patient Education ©2016 Elsevier Inc. ° °

## 2016-03-02 NOTE — ED Provider Notes (Signed)
CSN: [id number]     Arrival date & time 03/02/16  0707 History   First MD Initiated Contact with Patient 03/02/16 [phone]     Chief Complaint  Patient presents with  . Rectal Bleeding   Pt is a 45 yo bm with a hx of DVT who takes Xarelto for DVT.  The pt said that he has noticed some blood in his stool.  Pt said that he feels a "pinch" then he sees some blood.  The pt denies any cp or sob.  He works at [employer] and has not had any problems doing his job.  Pt said the stool has been normal in color.  When he sees the blood, it is just a little bit.  (Consider location/radiation/quality/duration/timing/severity/associated sxs/prior Treatment) Patient is a 45 y.o. male presenting with hematochezia. The history is provided by the patient.  Rectal Bleeding Quality:  Bright red Amount:  Scant Timing:  Intermittent Similar prior episodes: yes   Relieved by:  Nothing Worsened by:  Nothing tried Risk factors: anticoagulant use     Past Medical History  Diagnosis Date  . DVT (deep venous thrombosis) ([place])    History reviewed. No pertinent past surgical history. History reviewed. No pertinent family history. Social History  Substance Use Topics  . Smoking status: Never Smoker   . Smokeless tobacco: None  . Alcohol Use: Yes     Comment: occ    Review of Systems  Gastrointestinal: Positive for blood in stool and hematochezia.  All other systems reviewed and are negative.     Allergies  Aspirin; Caffeine; and Cherry  Home Medications   Prior to Admission medications   Medication Sig Start Date End Date Taking? Authorizing Provider  acetaminophen (TYLENOL) 500 MG tablet Take 1,000 mg by mouth every 6 (six) hours as needed for mild pain, fever or headache.   Yes Historical Provider, MD  ranitidine (ZANTAC) 75 MG tablet Take 75 mg by mouth 2 (two) times daily as needed for heartburn.   Yes Historical Provider, MD  Rivaroxaban 15 & 20 MG TBPK Take 15 mg by mouth 2 (two) times daily.  Take as directed on package: Start with one 15mg  tablet by mouth twice a day with food. On Day 22, switch to one 20mg  tablet once a day with food. 02/11/16  Yes Forde Dandy, MD  docusate sodium (COLACE) 100 MG capsule Take 1 capsule (100 mg total) by mouth every 12 (twelve) hours. 03/02/16   Isla Pence, MD  hydrocortisone (ANUSOL-HC) 2.5 % rectal cream Apply rectally 2 times daily 03/02/16   Isla Pence, MD   BP 152/88 mmHg  Pulse 72  Temp(Src) 98.5 F (36.9 C) (Oral)  Resp 18  SpO2 99% Physical Exam  Constitutional: He is oriented to person, place, and time. He appears well-developed and well-nourished.  HENT:  Head: Normocephalic and atraumatic.  Right Ear: External ear normal.  Left Ear: External ear normal.  Nose: Nose normal.  Mouth/Throat: Oropharynx is clear and moist.  Eyes: Conjunctivae and EOM are normal. Pupils are equal, round, and reactive to light.  Neck: Normal range of motion. Neck supple.  Cardiovascular: Normal rate, regular rhythm, normal heart sounds and intact distal pulses.   Pulmonary/Chest: Effort normal and breath sounds normal.  Abdominal: Soft. Bowel sounds are normal.  Genitourinary: Rectal exam shows no external hemorrhoid, no internal hemorrhoid, no fissure, no mass, no tenderness and anal tone normal. Guaiac negative stool.  Musculoskeletal: Normal range of motion.  Neurological: He is alert  and oriented to person, place, and time.  Skin: Skin is warm and dry.  Psychiatric: He has a normal mood and affect. His behavior is normal. Judgment and thought content normal.  Nursing note and vitals reviewed.   ED Course  Procedures (including critical care time) Labs Review Labs Reviewed  BASIC METABOLIC PANEL - Abnormal; Notable for the following:    Glucose, Bld 118 (*)    All other components within normal limits  PROTIME-INR - Abnormal; Notable for the following:    Prothrombin Time 22.1 (*)    INR 1.95 (*)    All other components within normal  limits  CBC WITH DIFFERENTIAL/PLATELET  POC OCCULT BLOOD, ED    Imaging Review No results found. I have personally reviewed and evaluated these images and lab results as part of my medical decision-making.   EKG Interpretation None      MDM  Bleeding is very minor and stool has been normal in color.  Bleeding has been going on for a few weeks and his H/H is fine.  I don't think that the Xarelto should be stopped.  Pt knows to return if bleeding worsens or if he notices any sob or dizziness.  Pt said that he had a nl colonoscopy in February.  He said that stool softeners and suppositories have helped with these sx in the past, so he will be given a rx for those.  Final diagnoses:  Rectal bleeding       Isla Pence, MD 03/02/16 [phone]

## 2016-03-02 NOTE — ED Notes (Signed)
Pt states that hes been on xarelto for 21 days and hes noticed blood in his stool since Sunday, he states that its dark red

## 2016-03-15 ENCOUNTER — Ambulatory Visit (INDEPENDENT_AMBULATORY_CARE_PROVIDER_SITE_OTHER): Payer: 59 | Admitting: Physician Assistant

## 2016-03-15 VITALS — BP 122/78 | HR 71 | Temp 98.2°F | Resp 17 | Ht 67.5 in | Wt 237.0 lb

## 2016-03-15 DIAGNOSIS — I82401 Acute embolism and thrombosis of unspecified deep veins of right lower extremity: Secondary | ICD-10-CM

## 2016-03-15 DIAGNOSIS — I82409 Acute embolism and thrombosis of unspecified deep veins of unspecified lower extremity: Secondary | ICD-10-CM | POA: Insufficient documentation

## 2016-03-15 MED ORDER — RIVAROXABAN 20 MG PO TABS: 20.0000 mg | ORAL_TABLET | Freq: Every day | ORAL | 0 refills | Status: DC

## 2016-03-15 MED ORDER — RIVAROXABAN (XARELTO) VTE STARTER PACK (15 & 20 MG): 20.0000 mg | ORAL_TABLET | Freq: Every day | ORAL | 0 refills | Status: DC

## 2016-03-15 NOTE — Progress Notes (Signed)
Patient ID: Tommy Walters, male     DOB: 08/02/1971, 45 y.o.    MRN: [id number]  PCP: No primary care provider on file.  Chief Complaint  Patient presents with  . Medication Refill    xarelto    Subjective:    HPI  Presents for Xarelto refill.  He was diagnosed with LE DVT in the ED on 6/30 and started on Xarelto. An appointment to establish with a PCP, Dr. Doreene Burke, is scheduled for 8/09, but he will run out of Xarelto prior to that visit.  This is his second DVT, the first was 2011, and occurred following a trip to [place] to visit his brother.  He has tolerated the Xarelto, without bleeding gums, increased bruising, hematuria. He did have some BRBPR following a sharp rectal pain with defecation, and was evaluated in the ED on 7/30. He was advised that it was due to hemorrhoidal bleeding. He has not had a recurrence.  The RIGHT leg is not tender, red or swollen. He denies CP, SOB, HA, dizziness, abdominal pain.    Prior to Admission medications   Medication Sig Start Date End Date Taking? Authorizing Provider  acetaminophen (TYLENOL) 500 MG tablet Take 1,000 mg by mouth every 6 (six) hours as needed for mild pain, fever or headache.   Yes Historical Provider, MD  hydrocortisone (ANUSOL-HC) 2.5 % rectal cream Apply rectally 2 times daily 03/02/16  Yes Isla Pence, MD  ranitidine (ZANTAC) 75 MG tablet Take 75 mg by mouth 2 (two) times daily as needed for heartburn.   Yes Historical Provider, MD  Rivaroxaban 15 & 20 MG TBPK Take 15 mg by mouth 2 (two) times daily. Take as directed on package: Start with one 15mg  tablet by mouth twice a day with food. On Day 22, switch to one 20mg  tablet once a day with food. 02/11/16  Yes Forde Dandy, MD  docusate sodium (COLACE) 100 MG capsule Take 1 capsule (100 mg total) by mouth every 12 (twelve) hours. Patient not taking: Reported on 03/15/2016 03/02/16   Isla Pence, MD     Allergies  Allergen Reactions  . Aspirin Other (See  Comments)    Too much causes blood in stool.  . Caffeine Other (See Comments)    GI upset  . Cherry Nausea And Vomiting     Patient Active Problem List   Diagnosis Date Noted  . DVT (deep venous thrombosis) ([place]) 03/15/2016     Family History  Problem Relation Age of Onset  . Hypertension Mother   . Diabetes Mother   . Stroke Mother 81    died from      Social History   Social History  . Marital status: Married    Spouse name: [name]  . Number of children: 4  . Years of education: MS   Occupational History  . Projects Chief Financial Officer     Social History Main Topics  . Smoking status: Never Smoker  . Smokeless tobacco: Never Used  . Alcohol use 0.6 oz/week    1 Glasses of wine per week     Comment: weekends  . Drug use: No  . Sexual activity: Yes   Other Topics Concern  . Not on file   Social History Narrative   Lives with wife and 3 daughters.   Son is in school elsewhere.        Review of Systems       Objective:  Physical Exam  Constitutional: He is oriented to  person, place, and time. He appears well-developed and well-nourished. He is active and cooperative. No distress.  BP 122/78 (BP Location: Right Arm, Patient Position: Sitting, Cuff Size: Normal)   Pulse 71   Temp 98.2 F (36.8 C) (Oral)   Resp 17   Ht 5' 7.5" (1.715 m)   Wt 237 lb (107.5 kg)   SpO2 98%   BMI 36.57 kg/m   HENT:  Head: Normocephalic and atraumatic.  Right Ear: Hearing normal.  Left Ear: Hearing normal.  Eyes: Conjunctivae are normal. No scleral icterus.  Neck: Normal range of motion. Neck supple. No thyromegaly present.  Cardiovascular: Normal rate, regular rhythm and normal heart sounds.   Pulses:      Radial pulses are 2+ on the right side, and 2+ on the left side.  Pulmonary/Chest: Effort normal and breath sounds normal.  Musculoskeletal:       Right lower leg: Normal.       Left lower leg: Normal.  Lymphadenopathy:       Head (right side): No tonsillar, no  preauricular, no posterior auricular and no occipital adenopathy present.       Head (left side): No tonsillar, no preauricular, no posterior auricular and no occipital adenopathy present.    He has no cervical adenopathy.       Right: No supraclavicular adenopathy present.       Left: No supraclavicular adenopathy present.  Neurological: He is alert and oriented to person, place, and time. No sensory deficit.  Skin: Skin is warm, dry and intact. No rash noted. No cyanosis or erythema. Nails show no clubbing.  Psychiatric: He has a normal mood and affect. His speech is normal and behavior is normal.             Assessment & Plan:  1. Deep vein thrombosis (DVT) of right lower extremity, unspecified chronicity, unspecified vein (HCC) Continue Xarelto at 20 mg daily. Proceed with visit next week with his new PCP. - rivaroxaban (XARELTO) 20 MG TABS tablet; Take 1 tablet (20 mg total) by mouth daily with supper.  Dispense: 30 tablet; Refill: 0   Fara Chute, PA-C Physician Assistant-Certified [facility]

## 2016-03-15 NOTE — Progress Notes (Signed)
Subjective:    Patient ID: Tommy Walters, male    DOB: August 28, 1970, 45 y.o.   MRN: [id number]  HPI Tommy Walters presents today needing a refill of his xarelto after being diagnosed with a DVT in his RLE last month. He has been taking his xarelto as directed. Has a prior history of DVT in 2011 also. Has an appointment to see his new PCP on 8/9. No pain in the RLE, no swelling, redness or heat. Denies claudication.   Review of Systems  Constitutional: Negative for fever.  Respiratory: Negative for cough and shortness of breath.   Cardiovascular: Negative for chest pain, palpitations and leg swelling.  Neurological: Negative for numbness.     Past Medical History:  Diagnosis Date  . DVT (deep venous thrombosis) (HCC)      Current Outpatient Prescriptions:  .  acetaminophen (TYLENOL) 500 MG tablet, Take 1,000 mg by mouth every 6 (six) hours as needed for mild pain, fever or headache., Disp: , Rfl:  .  hydrocortisone (ANUSOL-HC) 2.5 % rectal cream, Apply rectally 2 times daily, Disp: 28.35 g, Rfl: 0 .  ranitidine (ZANTAC) 75 MG tablet, Take 75 mg by mouth 2 (two) times daily as needed for heartburn., Disp: , Rfl:  .  Rivaroxaban 15 & 20 MG TBPK, Take 15 mg by mouth 2 (two) times daily. Take as directed on package: Start with one 15mg  tablet by mouth twice a day with food. On Day 22, switch to one 20mg  tablet once a day with food., Disp: 51 each, Rfl: 0 .  docusate sodium (COLACE) 100 MG capsule, Take 1 capsule (100 mg total) by mouth every 12 (twelve) hours. (Patient not taking: Reported on 03/15/2016), Disp: 60 capsule, Rfl: 0   Allergies  Allergen Reactions  . Aspirin Other (See Comments)    Too much causes blood in stool.  . Caffeine Other (See Comments)    GI upset  . Cherry Nausea And Vomiting      Objective:   Physical Exam  Constitutional: He is oriented to person, place, and time. He appears well-developed and well-nourished. He is cooperative. No distress.  Blood pressure 122/78,  pulse 71, temperature 98.2 F (36.8 C), temperature source Oral, resp. rate 17, height 5' 7.5" (1.715 m), weight 237 lb (107.5 kg), SpO2 98 %.  HENT:  Head: Normocephalic and atraumatic.  Eyes: Conjunctivae and lids are normal.  Neck: Trachea normal and normal range of motion. Neck supple.  Cardiovascular: Normal rate, regular rhythm and normal heart sounds.   Pulses:      Radial pulses are 2+ on the right side, and 2+ on the left side.       Dorsalis pedis pulses are 2+ on the right side, and 2+ on the left side.       Posterior tibial pulses are 2+ on the right side, and 2+ on the left side.  Negative Homan's BL  Pulmonary/Chest: Effort normal and breath sounds normal.  Musculoskeletal: Normal range of motion.  Lymphadenopathy:    He has no cervical adenopathy.  Neurological: He is alert and oriented to person, place, and time.  Skin: Skin is warm, dry and intact.  Psychiatric: He has a normal mood and affect. His speech is normal and behavior is normal.        Assessment & Plan:  1. Deep vein thrombosis (DVT) of right lower extremity, unspecified chronicity, unspecified vein (HCC) - rivaroxaban (XARELTO) 20 MG TABS tablet; Take 1 tablet (20 mg total) by mouth  daily with supper.  Dispense: 30 tablet; Refill: 0  Kelly Rayburn PA-S 03/15/16

## 2016-03-15 NOTE — Patient Instructions (Addendum)
Establish with Dr. Doreene Burke on 8/09 as planned.    IF you received an x-ray today, you will receive an invoice from [facility]. Please contact [facility] at [phone] with questions or concerns regarding your invoice.   IF you received labwork today, you will receive an invoice from [employer]. Please contact [facility] at [phone] with questions or concerns regarding your invoice.   Our billing staff will not be able to assist you with questions regarding bills from these companies.  You will be contacted with the lab results as soon as they are available. The fastest way to get your results is to activate your My Chart account. Instructions are located on the last page of this paperwork. If you have not heard from [place] regarding the results in 2 weeks, please contact this office.

## 2016-03-16 ENCOUNTER — Telehealth: Payer: Self-pay

## 2016-03-16 NOTE — Telephone Encounter (Signed)
PA completed for Xarelto on ins carrier [email or web address] portal. I should receive an email with determination when done.

## 2016-03-22 ENCOUNTER — Ambulatory Visit (INDEPENDENT_AMBULATORY_CARE_PROVIDER_SITE_OTHER): Payer: 59 | Admitting: Family Medicine

## 2016-03-22 ENCOUNTER — Encounter: Payer: Self-pay | Admitting: Family Medicine

## 2016-03-22 VITALS — BP 132/60 | HR 67 | Temp 98.2°F | Resp 16 | Ht 68.0 in | Wt 238.0 lb

## 2016-03-22 DIAGNOSIS — I82401 Acute embolism and thrombosis of unspecified deep veins of right lower extremity: Secondary | ICD-10-CM | POA: Diagnosis not present

## 2016-03-22 DIAGNOSIS — Z114 Encounter for screening for human immunodeficiency virus [HIV]: Secondary | ICD-10-CM

## 2016-03-22 MED ORDER — RIVAROXABAN 20 MG PO TABS: 20.0000 mg | ORAL_TABLET | Freq: Every day | ORAL | 3 refills | Status: DC

## 2016-03-22 NOTE — Progress Notes (Signed)
Tommy Walters, is a 45 y.o. male  [id number]  [id number]  DOB - 1970-12-29  CC:  Chief Complaint  Patient presents with  . Establish Care    hospital follow up for blood clot        HPI: Tommy Walters is a 45 y.o. male here to establish care. He has a history of DVT in 2011 and again about 5 weeks ago. He was placed on Xeralto 20 mg. Daily in hospital and was told to follow-up here to establish care and receive further refills. He denies any other chronic illnesses and is not on any chronic medications. He does have a history of hemorrhoids and occ rectal bleeding. He also uses anusol when needed and colace for constipation. He takes OTC Zantac for occ heartburn.  He reports his tetanus immunization is utd. He has not been screened for HIV.  Allergies  Allergen Reactions  . Aspirin Other (See Comments)    Too much causes blood in stool.  . Caffeine Other (See Comments)    GI upset  . Cherry Nausea And Vomiting   Past Medical History:  Diagnosis Date  . DVT (deep venous thrombosis) ([place])    Current Outpatient Prescriptions on File Prior to Visit  Medication Sig Dispense Refill  . acetaminophen (TYLENOL) 500 MG tablet Take 1,000 mg by mouth every 6 (six) hours as needed for mild pain, fever or headache.    . ranitidine (ZANTAC) 75 MG tablet Take 75 mg by mouth 2 (two) times daily as needed for heartburn.    . docusate sodium (COLACE) 100 MG capsule Take 1 capsule (100 mg total) by mouth every 12 (twelve) hours. (Patient not taking: Reported on 03/15/2016) 60 capsule 0  . hydrocortisone (ANUSOL-HC) 2.5 % rectal cream Apply rectally 2 times daily (Patient not taking: Reported on 03/22/2016) 28.35 g 0   No current facility-administered medications on file prior to visit.    Family History  Problem Relation Age of Onset  . Hypertension Mother   . Diabetes Mother   . Stroke Mother 34    died from    Social History   Social History  . Marital status: Married    Spouse  name: [name]  . Number of children: 4  . Years of education: MS   Occupational History  . Projects Chief Financial Officer     Social History Main Topics  . Smoking status: Never Smoker  . Smokeless tobacco: Never Used  . Alcohol use 0.6 oz/week    1 Glasses of wine per week     Comment: occ.   . Drug use: No  . Sexual activity: Yes   Other Topics Concern  . Not on file   Social History Narrative   Lives with wife and 3 daughters.   Son is in school elsewhere.    Review of Systems: Constitutional: Negative for fever, chills, appetite change, weight loss,  Fatigue. Skin: Negative for rashes or lesions of concern. HENT: Negative for ear pain, ear discharge.nose bleeds Eyes: Negative for pain, discharge, redness, itching and visual disturbance. Neck: Negative for pain, stiffness Respiratory: Negative for cough, shortness of breath,   Cardiovascular: Negative for chest pain, palpitations and leg swelling. Gastrointestinal: Negative for abdominal pain, nausea, vomiting, diarrhea, constipations. Occ rectal bleeding from hemorrhoids Genitourinary: Negative for dysuria, urgency, frequency, hematuria,  Musculoskeletal: Negative for back pain joint  swelling, and gait problem.Negative for weakness.Positive for right knee pain Neurological: Negative for dizziness, tremors, seizures, syncope,   light-headedness, numbness and headaches.  Hematological: Negative for easy bruising or bleeding Psychiatric/Behavioral: Negative for depression, anxiety, decreased concentration, confusion   Objective:   Vitals:   03/22/16 0902  BP: 132/60  Pulse: 67  Resp: 16  Temp: 98.2 F (36.8 C)    Physical Exam: Constitutional: Patient appears well-developed and well-nourished. No distress. HENT: Normocephalic, atraumatic, External right and left ear normal. Oropharynx is clear and moist.  Eyes: Conjunctivae and EOM are normal. PERRLA, no scleral icterus. Neck: Normal ROM. Neck supple. No lymphadenopathy, No  thyromegaly. CVS: RRR, S1/S2 +, no murmurs, no gallops, no rubs Pulmonary: Effort and breath sounds normal, no stridor, rhonchi, wheezes, rales.  Abdominal: Soft. Normoactive BS,, no distension, tenderness, rebound or guarding.  Musculoskeletal: Normal range of motion. No edema and no tenderness.  Neuro: Alert.Normal muscle tone coordination. Non-focal Skin: Skin is warm and dry. No rash noted. Not diaphoretic. No erythema. No pallor. Psychiatric: Normal mood and affect. Behavior, judgment, thought content normal.  Lab Results  Component Value Date   WBC 5.5 03/02/2016   HGB 14.3 03/02/2016   HCT 43.1 03/02/2016   MCV 80.7 03/02/2016   PLT 233 03/02/2016   Lab Results  Component Value Date   CREATININE 1.04 03/02/2016   BUN 18 03/02/2016   NA 138 03/02/2016   K 3.9 03/02/2016   CL 106 03/02/2016   CO2 26 03/02/2016    No results found for: HGBA1C Lipid Panel  No results found for: CHOL, TRIG, HDL, CHOLHDL, VLDL, LDLCALC     Assessment and plan:   1. Deep vein thrombosis (DVT) of right lower extremity, unspecified chronicity, unspecified vein (HCC)  - rivaroxaban (XARELTO) 20 MG TABS tablet; Take 1 tablet (20 mg total) by mouth daily with supper.  Dispense: 30 tablet; Refill: 3 - Lipid panel  2. Screening for HIV (human immunodeficiency virus)  - HIV antibody (with reflex)   Return in about 3 months (around 06/22/2016).  The patient was given clear instructions to go to ER or return to medical center if symptoms don't improve, worsen or new problems develop. The patient verbalized understanding.    Micheline Chapman FNP  03/22/2016, 9:32 AM

## 2016-03-22 NOTE — Patient Instructions (Signed)
Follow-up in ED for any suggestion of repeat blood clot. See me back in 3 months and as needed.

## 2016-03-23 ENCOUNTER — Telehealth: Payer: Self-pay

## 2016-03-23 LAB — LIPID PANEL
CHOLESTEROL: 213 mg/dL — AB (ref 125–200)
HDL: 54 mg/dL (ref >=40)
LDL CALC: 143 mg/dL — AB (ref <130)
TRIGLYCERIDES: 80 mg/dL (ref <150)
Total CHOL/HDL Ratio: 3.9 Ratio (ref <=5.0)
VLDL: 16 mg/dL (ref <30)

## 2016-03-23 LAB — HIV ANTIBODY (ROUTINE TESTING W REFLEX): HIV 1&2 Ab, 4th Generation: NONREACTIVE

## 2016-03-23 NOTE — Telephone Encounter (Signed)
Called, no answer. Left message for patient to return call. Thanks!  

## 2016-03-23 NOTE — Telephone Encounter (Signed)
-----   Message from Micheline Chapman, NP sent at 03/23/2016  7:47 AM EDT ----- HIV negative. Cholesterol total and LDL elevated. Would recommend diet and exercise for 3 months and recheck.

## 2016-03-23 NOTE — Telephone Encounter (Signed)
Patient returned call, I advised of labs and to eat low fat/ low cholesterol diet and to exercise. I advised patient that we would recheck in 3 months. Thanks!

## 2016-06-22 ENCOUNTER — Ambulatory Visit: Payer: 59 | Admitting: Family Medicine

## 2016-07-17 ENCOUNTER — Ambulatory Visit (INDEPENDENT_AMBULATORY_CARE_PROVIDER_SITE_OTHER): Payer: 59 | Admitting: Family Medicine

## 2016-07-17 ENCOUNTER — Encounter: Payer: Self-pay | Admitting: Family Medicine

## 2016-07-17 VITALS — BP 133/81 | HR 70 | Temp 98.1°F | Resp 14 | Ht 68.0 in | Wt 227.0 lb

## 2016-07-17 DIAGNOSIS — Z0000 Encounter for general adult medical examination without abnormal findings: Secondary | ICD-10-CM

## 2016-07-17 DIAGNOSIS — Z13220 Encounter for screening for lipoid disorders: Secondary | ICD-10-CM

## 2016-07-17 DIAGNOSIS — Z23 Encounter for immunization: Secondary | ICD-10-CM | POA: Diagnosis not present

## 2016-07-17 DIAGNOSIS — Z131 Encounter for screening for diabetes mellitus: Secondary | ICD-10-CM | POA: Diagnosis not present

## 2016-07-17 LAB — CBC WITH DIFFERENTIAL/PLATELET
BASOS ABS: 62 {cells}/uL (ref 0–200)
Basophils Relative: 1 %
EOS ABS: 310 {cells}/uL (ref 15–500)
Eosinophils Relative: 5 %
HEMATOCRIT: 46 % (ref 38.5–50.0)
HEMOGLOBIN: 14.9 g/dL (ref 13.2–17.1)
LYMPHS ABS: 2604 {cells}/uL (ref 850–3900)
Lymphocytes Relative: 42 %
MCH: 27 pg (ref 27.0–33.0)
MCHC: 32.4 g/dL (ref 32.0–36.0)
MCV: 83.3 fL (ref 80.0–100.0)
MONO ABS: 434 {cells}/uL (ref 200–950)
MPV: 9.9 fL (ref 7.5–12.5)
Monocytes Relative: 7 %
NEUTROS ABS: 2790 {cells}/uL (ref 1500–7800)
NEUTROS PCT: 45 %
Platelets: 218 10*3/uL (ref 140–400)
RBC: 5.52 MIL/uL (ref 4.20–5.80)
RDW: 13.9 % (ref 11.0–15.0)
WBC: 6.2 10*3/uL (ref 3.8–10.8)

## 2016-07-17 LAB — COMPLETE METABOLIC PANEL WITH GFR
ALBUMIN: 4.4 g/dL (ref 3.6–5.1)
ALT: 30 U/L (ref 9–46)
AST: 18 U/L (ref 10–40)
Alkaline Phosphatase: 61 U/L (ref 40–115)
BUN: 13 mg/dL (ref 7–25)
CHLORIDE: 105 mmol/L (ref 98–110)
CO2: 25 mmol/L (ref 20–31)
Calcium: 9.4 mg/dL (ref 8.6–10.3)
Creat: 1.04 mg/dL (ref 0.60–1.35)
GFR, Est African American: >89 mL/min (ref >=60)
GFR, Est Non African American: 85 mL/min (ref >=60)
GLUCOSE: 104 mg/dL — AB (ref 65–99)
POTASSIUM: 4.2 mmol/L (ref 3.5–5.3)
SODIUM: 138 mmol/L (ref 135–146)
Total Bilirubin: 0.4 mg/dL (ref 0.2–1.2)
Total Protein: 7.4 g/dL (ref 6.1–8.1)

## 2016-07-17 LAB — LIPID PANEL
CHOL/HDL RATIO: 4.4 ratio (ref <5.0)
Cholesterol: 218 mg/dL — ABNORMAL HIGH (ref <200)
HDL: 50 mg/dL (ref >40)
LDL CALC: 154 mg/dL — AB (ref <100)
Triglycerides: 69 mg/dL (ref <150)
VLDL: 14 mg/dL (ref <30)

## 2016-07-17 MED ORDER — HYDROCORTISONE 2.5 % RE CREA: TOPICAL_CREAM | RECTAL | 0 refills | Status: DC

## 2016-07-17 NOTE — Patient Instructions (Signed)
Finish you current Xarelto, then discontinue. Follow-up in 6 months. ED if any symptoms of DVT.

## 2016-07-17 NOTE — Progress Notes (Signed)
Tommy Walters, is a 45 y.o. male  [id number]  [id number]  DOB - 1970/10/19  CC:  Chief Complaint  Patient presents with  . follow up    recent DVT , no concerns today, would like refill on Annusol Cream for recurrent  issues       HPI: Tommy Walters is a 45 y.o. male here for follow-up DVD. He had a non-provoked DVT in late June. He did have a presumed provoked DVT in 2008. He has been on Xarelto since late June. He reports no symptoms of a recurrent DVT and reports no easy bleeding or bruising. He does ask for a refill on Anusol for hemorrhoids. He has enough Xarelto for another month, which will complete his 6 months. He does not smoke, drinks occ redwine.   Health Maintenance: Will receive Tdap today. Has already had influenza.   Allergies  Allergen Reactions  . Aspirin Other (See Comments)    Too much causes blood in stool.  . Caffeine Other (See Comments)    GI upset  . Cherry Nausea And Vomiting   Past Medical History:  Diagnosis Date  . DVT (deep venous thrombosis) ([place])    Current Outpatient Prescriptions on File Prior to Visit  Medication Sig Dispense Refill  . acetaminophen (TYLENOL) 500 MG tablet Take 1,000 mg by mouth every 6 (six) hours as needed for mild pain, fever or headache.    . rivaroxaban (XARELTO) 20 MG TABS tablet Take 1 tablet (20 mg total) by mouth daily with supper. 30 tablet 3  . docusate sodium (COLACE) 100 MG capsule Take 1 capsule (100 mg total) by mouth every 12 (twelve) hours. (Patient not taking: Reported on 07/17/2016) 60 capsule 0  . ranitidine (ZANTAC) 75 MG tablet Take 75 mg by mouth 2 (two) times daily as needed for heartburn.     No current facility-administered medications on file prior to visit.    Family History  Problem Relation Age of Onset  . Hypertension Mother   . Diabetes Mother   . Stroke Mother 57    died from    Social History   Social History  . Marital status: Married    Spouse name: [name]  . Number of  children: 4  . Years of education: MS   Occupational History  . Projects Chief Financial Officer     Social History Main Topics  . Smoking status: Never Smoker  . Smokeless tobacco: Never Used  . Alcohol use 0.6 oz/week    1 Glasses of wine per week     Comment: occ.   . Drug use: No  . Sexual activity: Yes   Other Topics Concern  . Not on file   Social History Narrative   Lives with wife and 3 daughters.   Son is in school elsewhere.    Review of Systems: Constitutional: Negative Skin: Negative HENT: Negative  Eyes: Negative  Neck: Negative Respiratory: Negative Cardiovascular: Negative Gastrointestinal: Negative Genitourinary: Negative  Musculoskeletal: Negative   Neurological: Negative for Hematological: Negative  Psychiatric/Behavioral: Negative, except for insomnia.    Objective:   Vitals:   07/17/16 0823  BP: 133/81  Pulse: 70  Resp: 14  Temp: 98.1 F (36.7 C)    Physical Exam: Constitutional: Patient appears well-developed and well-nourished. No distress. HENT: Normocephalic, atraumatic, External right and left ear normal. Oropharynx is clear and moist.  Eyes: Conjunctivae and EOM are normal. PERRLA, no scleral icterus. Neck: Normal ROM. Neck supple. No lymphadenopathy, No thyromegaly. CVS: RRR, S1/S2 +,  no murmurs, no gallops, no rubs Pulmonary: Effort and breath sounds normal, no stridor, rhonchi, wheezes, rales.  Abdominal: Soft. Normoactive BS,, no distension, tenderness, rebound or guarding.  Musculoskeletal: Normal range of motion. No edema and no tenderness.  Neuro: Alert.Normal muscle tone coordination. Non-focal Skin: Skin is warm and dry. No rash noted. Not diaphoretic. No erythema. No pallor. Psychiatric: Normal mood and affect. Behavior, judgment, thought content normal.  Lab Results  Component Value Date   WBC 5.5 03/02/2016   HGB 14.3 03/02/2016   HCT 43.1 03/02/2016   MCV 80.7 03/02/2016   PLT 233 03/02/2016   Lab Results  Component Value  Date   CREATININE 1.04 03/02/2016   BUN 18 03/02/2016   NA 138 03/02/2016   K 3.9 03/02/2016   CL 106 03/02/2016   CO2 26 03/02/2016    No results found for: HGBA1C Lipid Panel     Component Value Date/Time   CHOL 213 (H) 03/22/2016 0931   TRIG 80 03/22/2016 0931   HDL 54 03/22/2016 0931   CHOLHDL 3.9 03/22/2016 0931   VLDL 16 03/22/2016 0931   LDLCALC 143 (H) 03/22/2016 0931        Assessment and plan:   1. Screening for diabetes mellitus  - POCT glycosylated hemoglobin (Hb A1C)  2. Screening cholesterol level  - Lipid panel  3. Healthcare maintenance  - COMPLETE METABOLIC PANEL WITH GFR - CBC with Differential   Return in about 6 months (around 01/15/2017).  The patient was given clear instructions to go to ER or return to medical center if symptoms don't improve, worsen or new problems develop. The patient verbalized understanding.    Micheline Chapman FNP  07/17/2016, 10:13 AM

## 2016-08-10 ENCOUNTER — Ambulatory Visit (INDEPENDENT_AMBULATORY_CARE_PROVIDER_SITE_OTHER): Payer: 59

## 2016-08-10 DIAGNOSIS — R739 Hyperglycemia, unspecified: Secondary | ICD-10-CM | POA: Diagnosis not present

## 2016-08-10 LAB — POCT GLYCOSYLATED HEMOGLOBIN (HGB A1C): HEMOGLOBIN A1C: 5.5

## 2016-08-21 ENCOUNTER — Telehealth: Payer: Self-pay | Admitting: Family Medicine

## 2016-08-21 ENCOUNTER — Other Ambulatory Visit: Payer: Self-pay | Admitting: Family Medicine

## 2016-08-21 MED ORDER — SIMVASTATIN 40 MG PO TABS: 40.0000 mg | ORAL_TABLET | Freq: Every day | ORAL | 1 refills | Status: DC

## 2016-08-21 NOTE — Telephone Encounter (Signed)
Labs reported to patient, advised him to start Cholesterol rx per Sharon Seller NP, to call with any concerns.

## 2016-10-09 ENCOUNTER — Encounter: Payer: Self-pay | Admitting: Family Medicine

## 2016-10-09 ENCOUNTER — Ambulatory Visit (INDEPENDENT_AMBULATORY_CARE_PROVIDER_SITE_OTHER): Payer: 59 | Admitting: Family Medicine

## 2016-10-09 VITALS — BP 138/69 | HR 84 | Temp 98.5°F | Resp 16 | Wt 227.8 lb

## 2016-10-09 DIAGNOSIS — B9789 Other viral agents as the cause of diseases classified elsewhere: Secondary | ICD-10-CM

## 2016-10-09 DIAGNOSIS — J069 Acute upper respiratory infection, unspecified: Secondary | ICD-10-CM

## 2016-10-09 NOTE — Progress Notes (Signed)
Tommy Walters, is a 46 y.o. male  [id number]  [id number]  DOB - 1971-06-22  CC:  Chief Complaint  Patient presents with  . Nasal Congestion  . Eye Problem    itching and watering   . Nausea       HPI: Isreal Walters is a 46 y.o. male here for a 5 day history of nasal congestion, itchy eyes, ST, feeling weak and feverish. He endorses occ cough and occ headache   Allergies  Allergen Reactions  . Aspirin Other (See Comments)    Too much causes blood in stool.  . Caffeine Other (See Comments)    GI upset  . Cherry Nausea And Vomiting   Past Medical History:  Diagnosis Date  . DVT (deep venous thrombosis) ([place])    Current Outpatient Prescriptions on File Prior to Visit  Medication Sig Dispense Refill  . acetaminophen (TYLENOL) 500 MG tablet Take 1,000 mg by mouth every 6 (six) hours as needed for mild pain, fever or headache.    . docusate sodium (COLACE) 100 MG capsule Take 1 capsule (100 mg total) by mouth every 12 (twelve) hours. 60 capsule 0  . hydrocortisone (ANUSOL-HC) 2.5 % rectal cream Apply rectally 2 times daily 28.35 g 0  . ranitidine (ZANTAC) 75 MG tablet Take 75 mg by mouth 2 (two) times daily as needed for heartburn.    . simvastatin (ZOCOR) 40 MG tablet Take 1 tablet (40 mg total) by mouth at bedtime. 90 tablet 1  . rivaroxaban (XARELTO) 20 MG TABS tablet Take 1 tablet (20 mg total) by mouth daily with supper. (Patient not taking: Reported on 10/09/2016) 30 tablet 3   No current facility-administered medications on file prior to visit.    Family History  Problem Relation Age of Onset  . Hypertension Mother   . Diabetes Mother   . Stroke Mother 60    died from    Social History   Social History  . Marital status: Married    Spouse name: [name]  . Number of children: 4  . Years of education: MS   Occupational History  . Projects Chief Financial Officer     Social History Main Topics  . Smoking status: Never Smoker  . Smokeless tobacco: Never Used  .  Alcohol use 0.6 oz/week    1 Glasses of wine per week     Comment: occ.   . Drug use: No  . Sexual activity: Yes   Other Topics Concern  . Not on file   Social History Narrative   Lives with wife and 3 daughters.   Son is in school elsewhere.    Review of Systems: Constitutional: + fatigue, weakness Skin: Negative HENT: + nasal congestion, ST Eyes: + itchy, watery eyes Neck: Negative Respiratory: +occ cough Cardiovascular: Negative Gastrointestinal: Negative Genitourinary: Negative  Musculoskeletal: Negative   Neurological: Negative for Hematological: Negative  Psychiatric/Behavioral: Negative    Objective:   Vitals:   10/09/16 0901  BP: 138/69  Pulse: 84  Resp: 16  Temp: 98.5 F (36.9 C)    Physical Exam: Constitutional: Patient appears well-developed and well-nourished. No distress. HENT: Normocephalic, atraumatic, External right and left ear normal. Oropharynx is clear and moist. Nose is congested Eyes:  EOM are normal. PERRLA, no scleral icterus.Conjunctiva mildly inflammed Neck: Normal ROM. Neck supple. No lymphadenopathy, No thyromegaly. CVS: RRR, S1/S2 +, no murmurs, no gallops, no rubs Pulmonary: Effort and breath sounds normal, no stridor, rhonchi, wheezes, rales.  Abdominal: Soft. Normoactive BS,, no distension, tenderness,  rebound or guarding.  Musculoskeletal: Normal range of motion. No edema and no tenderness.  Neuro: Alert.Normal muscle tone coordination. Non-focal Skin: Skin is warm and dry. No rash noted. Not diaphoretic. No erythema. No pallor. Psychiatric: Normal mood and affect. Behavior, judgment, thought content normal.  Lab Results  Component Value Date   WBC 6.2 07/17/2016   HGB 14.9 07/17/2016   HCT 46.0 07/17/2016   MCV 83.3 07/17/2016   PLT 218 07/17/2016   Lab Results  Component Value Date   CREATININE 1.04 07/17/2016   BUN 13 07/17/2016   NA 138 07/17/2016   K 4.2 07/17/2016   CL 105 07/17/2016   CO2 25 07/17/2016     Lab Results  Component Value Date   HGBA1C 5.5 08/10/2016   Lipid Panel     Component Value Date/Time   CHOL 218 (H) 07/17/2016 0849   TRIG 69 07/17/2016 0849   HDL 50 07/17/2016 0849   CHOLHDL 4.4 07/17/2016 0849   VLDL 14 07/17/2016 0849   LDLCALC 154 (H) 07/17/2016 0849        Assessment and plan:   1. Viral URI - lots of fluid OTC meds as needed and desired  Return if symptoms worsen or fail to improve.  The patient was given clear instructions to go to ER or return to medical center if symptoms don't improve, worsen or new problems develop. The patient verbalized understanding.    Micheline Chapman FNP  10/09/2016, 9:16 AM

## 2016-10-09 NOTE — Patient Instructions (Signed)
Lots of liquids OTC medications as needed Follow-up in one week if not significantly better.

## 2017-01-15 ENCOUNTER — Encounter: Payer: Self-pay | Admitting: Family Medicine

## 2017-01-15 ENCOUNTER — Ambulatory Visit (INDEPENDENT_AMBULATORY_CARE_PROVIDER_SITE_OTHER): Payer: 59 | Admitting: Family Medicine

## 2017-01-15 VITALS — BP 131/75 | HR 65 | Temp 97.9°F | Resp 14 | Ht 68.0 in | Wt 230.0 lb

## 2017-01-15 DIAGNOSIS — E663 Overweight: Secondary | ICD-10-CM

## 2017-01-15 DIAGNOSIS — Z86718 Personal history of other venous thrombosis and embolism: Secondary | ICD-10-CM

## 2017-01-15 DIAGNOSIS — E785 Hyperlipidemia, unspecified: Secondary | ICD-10-CM | POA: Diagnosis not present

## 2017-01-15 DIAGNOSIS — R5383 Other fatigue: Secondary | ICD-10-CM | POA: Diagnosis not present

## 2017-01-15 LAB — CBC WITH DIFFERENTIAL/PLATELET
Basophils Absolute: 0 cells/uL (ref 0–200)
Basophils Relative: 0 %
EOS ABS: 136 {cells}/uL (ref 15–500)
Eosinophils Relative: 2 %
HEMATOCRIT: 43 % (ref 38.5–50.0)
Hemoglobin: 13.7 g/dL (ref 13.2–17.1)
LYMPHS PCT: 47 %
Lymphs Abs: 3196 cells/uL (ref 850–3900)
MCH: 26.6 pg — ABNORMAL LOW (ref 27.0–33.0)
MCHC: 31.9 g/dL — ABNORMAL LOW (ref 32.0–36.0)
MCV: 83.5 fL (ref 80.0–100.0)
MONO ABS: 544 {cells}/uL (ref 200–950)
MONOS PCT: 8 %
MPV: 9.6 fL (ref 7.5–12.5)
NEUTROS PCT: 43 %
Neutro Abs: 2924 cells/uL (ref 1500–7800)
PLATELETS: 208 10*3/uL (ref 140–400)
RBC: 5.15 MIL/uL (ref 4.20–5.80)
RDW: 13.8 % (ref 11.0–15.0)
WBC: 6.8 10*3/uL (ref 3.8–10.8)

## 2017-01-15 LAB — POCT URINALYSIS DIP (DEVICE)
Bilirubin Urine: NEGATIVE
Glucose, UA: NEGATIVE mg/dL
HGB URINE DIPSTICK: NEGATIVE
Ketones, ur: NEGATIVE mg/dL
LEUKOCYTES UA: NEGATIVE
Nitrite: NEGATIVE
PH: 5.5 (ref 5.0–8.0)
Protein, ur: NEGATIVE mg/dL
SPECIFIC GRAVITY, URINE: 1.015 (ref 1.005–1.030)
UROBILINOGEN UA: 0.2 mg/dL (ref 0.0–1.0)

## 2017-01-15 LAB — POCT GLYCOSYLATED HEMOGLOBIN (HGB A1C): Hemoglobin A1C: 5.5

## 2017-01-15 NOTE — Patient Instructions (Addendum)
I will notify you of your labs and whether or not to continue simvastatin.  Continue diet regimen and routine exercise.  Resume baby aspirin 81 mg daily. If you see any blood in stool discontinue use.   Prior to any extensive travel, notify me to discuss prophylactic anticoagulation.         Exercising to Stay Healthy Exercising regularly is important. It has many health benefits, such as:  Improving your overall fitness, flexibility, and endurance.  Increasing your bone density.  Helping with weight control.  Decreasing your body fat.  Increasing your muscle strength.  Reducing stress and tension.  Improving your overall health.  In order to become healthy and stay healthy, it is recommended that you do moderate-intensity and vigorous-intensity exercise. You can tell that you are exercising at a moderate intensity if you have a higher heart rate and faster breathing, but you are still able to hold a conversation. You can tell that you are exercising at a vigorous intensity if you are breathing much harder and faster and cannot hold a conversation while exercising. How often should I exercise? Choose an activity that you enjoy and set realistic goals. Your health care provider can help you to make an activity plan that works for you. Exercise regularly as directed by your health care provider. This may include:  Doing resistance training twice each week, such as: ? Push-ups. ? Sit-ups. ? Lifting weights. ? Using resistance bands.  Doing a given intensity of exercise for a given amount of time. Choose from these options: ? 150 minutes of moderate-intensity exercise every week. ? 75 minutes of vigorous-intensity exercise every week. ? A mix of moderate-intensity and vigorous-intensity exercise every week.  Children, pregnant women, people who are out of shape, people who are overweight, and older adults may need to consult a health care provider for individual  recommendations. If you have any sort of medical condition, be sure to consult your health care provider before starting a new exercise program. What are some exercise ideas? Some moderate-intensity exercise ideas include:  Walking at a rate of 1 mile in 15 minutes.  Biking.  Hiking.  Golfing.  Dancing.  Some vigorous-intensity exercise ideas include:  Walking at a rate of at least 4.5 miles per hour.  Jogging or running at a rate of 5 miles per hour.  Biking at a rate of at least 10 miles per hour.  Lap swimming.  Roller-skating or in-line skating.  Cross-country skiing.  Vigorous competitive sports, such as football, basketball, and soccer.  Jumping rope.  Aerobic dancing.  What are some everyday activities that can help me to get exercise?  [place] work, such as: ? Pushing a Conservation officer, nature. ? Raking and bagging leaves.  Washing and waxing your car.  Pushing a stroller.  Shoveling snow.  Gardening.  Washing windows or floors. How can I be more active in my day-to-day activities?  Use the stairs instead of the elevator.  Take a walk during your lunch break.  If you drive, park your car farther away from work or school.  If you take public transportation, get off one stop early and walk the rest of the way.  Make all of your phone calls while standing up and walking around.  Get up, stretch, and walk around every 30 minutes throughout the day. What guidelines should I follow while exercising?  Do not exercise so much that you hurt yourself, feel dizzy, or get very short of breath.  Consult your  health care provider before starting a new exercise program.  Wear comfortable clothes and shoes with good support.  Drink plenty of water while you exercise to prevent dehydration or heat stroke. Body water is lost during exercise and must be replaced.  Work out until you breathe faster and your heart beats faster. This information is not intended to replace  advice given to you by your health care provider. Make sure you discuss any questions you have with your health care provider. Document Released: 09/02/2010 Document Revised: 01/06/2016 Document Reviewed: 01/01/2014 Elsevier Interactive Patient Education  [employer].

## 2017-01-15 NOTE — Progress Notes (Signed)
Patient ID: Tommy Walters, male    DOB: 22-Nov-1970, 46 y.o.   MRN: [id number]  PCP: Tommy Jun, FNP  Chief Complaint  Patient presents with  . Follow-up    6 month on cholseterol     Subjective:  HPI  Tommy Walters is a 46 y.o. male presents for a routine 6 month wellness exam.   Medical history significant for DVT x 2 occurrences. 2011 and 2017.  Tommy Walters was placed on 6 months of anticoagulant therapy with Xarelto which he completed about 2 months ago. There was some confusion regarding his reaction to aspirin and history of rectal bleeding.  Tommy Walters confirms that rectal bleeding was from hemorrhoid secondary to "hard stool" and constipation. Since being off of Xarelto, he has not resumed aspirin therapy which was stopped 9 months ago once he  started anticoagulation therapy for DVT.  Routine health screening Patient is non-tobacco user. Last hemoglobin A1c 5.5 (12/17)  He obtains routine weekly, vigorous physical activity, 3 times per week for a minimal of 2 hours. With activity he denies any associated shortness of breath, chest pain, headaches, or dizziness. Reports today feeling like his energy level is low. He works really long hours, which causes his evenings  At home to consist of eating and going bed several nights during the week. Tries to eat really healthy by  incorporating green leafy  Vegetables and minimizing fast food.  Social History   Social History  . Marital status: Married    Spouse name: Tommy Walters  . Number of children: 4  . Years of education: MS   Occupational History  . Projects Chief Financial Officer     Social History Main Topics  . Smoking status: Never Smoker  . Smokeless tobacco: Never Used  . Alcohol use 0.6 oz/week    1 Glasses of wine per week     Comment: occ.   . Drug use: No  . Sexual activity: Yes   Other Topics Concern  . Not on file   Social History Narrative   Lives with wife and 3 daughters.   Son is in school elsewhere.     Family History  Problem Relation Age of Onset  . Hypertension Mother   . Diabetes Mother   . Stroke Mother 93       died from    Review of Systems  See HPI  Patient Active Problem List   Diagnosis Date Noted  . DVT (deep venous thrombosis) ([place]) 03/15/2016    Allergies  Allergen Reactions  . Aspirin Other (See Comments)    Too much causes blood in stool.  . Caffeine Other (See Comments)    GI upset  . Cherry Nausea And Vomiting    Prior to Admission medications   Medication Sig Start Date End Date Taking? Authorizing Provider  acetaminophen (TYLENOL) 500 MG tablet Take 1,000 mg by mouth every 6 (six) hours as needed for mild pain, fever or headache.    [provider]  docusate sodium (COLACE) 100 MG capsule Take 1 capsule (100 mg total) by mouth every 12 (twelve) hours. 03/02/16   Isla Pence, MD  hydrocortisone (ANUSOL-HC) 2.5 % rectal cream Apply rectally 2 times daily 07/17/16   Micheline Chapman, NP  ranitidine (ZANTAC) 75 MG tablet Take 75 mg by mouth 2 (two) times daily as needed for heartburn.    [provider]  rivaroxaban (XARELTO) 20 MG TABS tablet Take 1 tablet (20 mg total) by mouth daily with supper.  Patient not taking: Reported on 10/09/2016 03/22/16   Micheline Chapman, NP  simvastatin (ZOCOR) 40 MG tablet Take 1 tablet (40 mg total) by mouth at bedtime. 08/21/16   Micheline Chapman, NP    Past Medical, Surgical Family and Social History reviewed and updated.    Objective:  There were no vitals filed for this visit.  Wt Readings from Last 3 Encounters:  10/09/16 227 lb 12.8 oz (103.3 kg)  07/17/16 227 lb (103 kg)  03/22/16 238 lb (108 kg)   Physical Exam  Constitutional: He is oriented to person, place, and time. He appears well-developed and well-nourished.  HENT:  Head: Normocephalic.  Eyes: Conjunctivae are normal. Pupils are equal, round, and reactive to light.  Neck: Normal range of motion. Neck supple. No thyromegaly  present.  Cardiovascular: Normal rate, regular rhythm, normal heart sounds and intact distal pulses.   Pulmonary/Chest: Effort normal and breath sounds normal.  Abdominal: Soft. Bowel sounds are normal.  Musculoskeletal: Normal range of motion.  Neurological: He is alert and oriented to person, place, and time.  Skin: Skin is warm and dry.  Psychiatric: He has a normal mood and affect. His behavior is normal. Judgment and thought content normal.    Assessment & Plan:  1. Hyperlipidemia, unspecified hyperlipidemia type - Lipid panel - Thyroid Panel With TSH -Adhere to diet low in saturated fats. Limit fast food and meats with high volumes of fat. -Continue physical activity 3 days per week totaling a minimal of 150 minutes per week.   2. Overweight (Body mass index is 34.97 kg/m.) - CBC with Differential -Continue physical activity 3 days per week totaling a minimal of 150 minutes per week.   3. Low energy - COMPLETE METABOLIC PANEL WITH GFR - POCT glycosylated hemoglobin (Hb A1C) -Continue physical activity 3 days per week totaling a minimal of 150 minutes per week.   4. History of DVT x 2 -Patient plans to travel out of country next year. We discussed at least one month prior to travel starting an anticoagulant prophylactic treatment.-Patient will notify me of travel plans 1 month before  Resume 81 mg aspirin daily. If any rectal bleeding occurs, discontinue and notify clinic.  RTC: September for employee required health physical form complete  Tommy Walters. Kenton Kingfisher, MSN, FNP-C [facility]  [address] [place], [place] [address] [phone]

## 2017-01-16 LAB — COMPLETE METABOLIC PANEL WITH GFR
ALT: 41 U/L (ref 9–46)
AST: 30 U/L (ref 10–40)
Albumin: 3.9 g/dL (ref 3.6–5.1)
Alkaline Phosphatase: 63 U/L (ref 40–115)
BILIRUBIN TOTAL: 0.3 mg/dL (ref 0.2–1.2)
BUN: 15 mg/dL (ref 7–25)
CALCIUM: 8.7 mg/dL (ref 8.6–10.3)
CHLORIDE: 111 mmol/L — AB (ref 98–110)
CO2: 22 mmol/L (ref 20–31)
CREATININE: 1.16 mg/dL (ref 0.60–1.35)
GFR, EST AFRICAN AMERICAN: 86 mL/min (ref >=60)
GFR, EST NON AFRICAN AMERICAN: 74 mL/min (ref >=60)
Glucose, Bld: 83 mg/dL (ref 65–99)
Potassium: 4.3 mmol/L (ref 3.5–5.3)
Sodium: 141 mmol/L (ref 135–146)
Total Protein: 6.9 g/dL (ref 6.1–8.1)

## 2017-01-16 LAB — THYROID PANEL WITH TSH
FREE THYROXINE INDEX: 2.3 (ref 1.4–3.8)
T3 UPTAKE: 30 % (ref 22–35)
T4 TOTAL: 7.8 ug/dL (ref 4.5–12.0)
TSH: 2.13 mIU/L (ref 0.40–4.50)

## 2017-01-16 LAB — LIPID PANEL
CHOL/HDL RATIO: 3.7 ratio (ref <5.0)
CHOLESTEROL: 97 mg/dL (ref <200)
HDL: 26 mg/dL — ABNORMAL LOW (ref >40)
LDL CALC: 58 mg/dL (ref <100)
Triglycerides: 66 mg/dL (ref <150)
VLDL: 13 mg/dL (ref <30)

## 2017-01-17 ENCOUNTER — Encounter: Payer: Self-pay | Admitting: Family Medicine

## 2017-01-17 NOTE — Progress Notes (Signed)
Please mail letter to patient  

## 2017-02-21 ENCOUNTER — Other Ambulatory Visit: Payer: Self-pay | Admitting: Hematology

## 2017-02-21 MED ORDER — SIMVASTATIN 40 MG PO TABS: 40.0000 mg | ORAL_TABLET | Freq: Every day | ORAL | 1 refills | Status: DC

## 2017-02-21 NOTE — Telephone Encounter (Signed)
Refill request for Zocor.  LOV 01-15-17

## 2017-04-17 ENCOUNTER — Ambulatory Visit: Payer: 59 | Admitting: Family Medicine

## 2017-05-18 ENCOUNTER — Ambulatory Visit (INDEPENDENT_AMBULATORY_CARE_PROVIDER_SITE_OTHER): Payer: 59 | Admitting: Family Medicine

## 2017-05-18 ENCOUNTER — Encounter: Payer: Self-pay | Admitting: Family Medicine

## 2017-05-18 VITALS — BP 140/80 | HR 67 | Temp 97.7°F | Resp 16 | Ht 68.0 in | Wt 231.0 lb

## 2017-05-18 DIAGNOSIS — Z0289 Encounter for other administrative examinations: Secondary | ICD-10-CM | POA: Diagnosis not present

## 2017-05-18 DIAGNOSIS — Z131 Encounter for screening for diabetes mellitus: Secondary | ICD-10-CM

## 2017-05-18 DIAGNOSIS — Z23 Encounter for immunization: Secondary | ICD-10-CM | POA: Diagnosis not present

## 2017-05-18 LAB — GLUCOSE, CAPILLARY: Glucose-Capillary: 89 mg/dL (ref 65–99)

## 2017-05-18 LAB — POCT GLYCOSYLATED HEMOGLOBIN (HGB A1C): Hemoglobin A1C: 5.3

## 2017-05-18 MED ORDER — SIMVASTATIN 40 MG PO TABS: 40.0000 mg | ORAL_TABLET | Freq: Every day | ORAL | 2 refills | Status: DC

## 2017-05-18 NOTE — Progress Notes (Signed)
Patient ID: Tommy Walters, male    DOB: 06/01/1971, 46 y.o.   MRN: [id number]  PCP: Scot Jun, FNP  Chief Complaint  Patient presents with  . employee form    Subjective:  HPI Tommy Walters is a 46 y.o. male presents for completion of employee health form. Tommy Walters completed his annual physical 01/15/2017. During that visit he completed fasting labs, which were all consistent with his baseline. He denies any complaints or issues today. Reports that he has been attempting routine physical activity. Denies any shortness of breath, chest pain, dizziness, or headaches.  Social History   Social History  . Marital status: Married    Spouse name: [name]  . Number of children: 4  . Years of education: MS   Occupational History  . Projects Chief Financial Officer     Social History Main Topics  . Smoking status: Never Smoker  . Smokeless tobacco: Never Used  . Alcohol use 0.6 oz/week    1 Glasses of wine per week     Comment: occ.   . Drug use: No  . Sexual activity: Yes   Other Topics Concern  . Not on file   Social History Narrative   Lives with wife and 3 daughters.   Son is in school elsewhere.    Family History  Problem Relation Age of Onset  . Hypertension Mother   . Diabetes Mother   . Stroke Mother 35       died from    Review of Systems He history of present illness Patient Active Problem List   Diagnosis Date Noted  . DVT (deep venous thrombosis) ([place]) 03/15/2016    Allergies  Allergen Reactions  . Aspirin Other (See Comments)    Too much causes blood in stool.  . Caffeine Other (See Comments)    GI upset  . Cherry Nausea And Vomiting    Prior to Admission medications   Medication Sig Start Date End Date Taking? Authorizing Provider  acetaminophen (TYLENOL) 500 MG tablet Take 1,000 mg by mouth every 6 (six) hours as needed for mild pain, fever or headache.    [provider]  docusate sodium (COLACE) 100 MG capsule Take 1 capsule (100 mg total)  by mouth every 12 (twelve) hours. Patient not taking: Reported on 01/15/2017 03/02/16   Isla Pence, MD  hydrocortisone (ANUSOL-HC) 2.5 % rectal cream Apply rectally 2 times daily Patient not taking: Reported on 01/15/2017 07/17/16   Micheline Chapman, NP  ranitidine (ZANTAC) 75 MG tablet Take 75 mg by mouth 2 (two) times daily as needed for heartburn.    [provider]  rivaroxaban (XARELTO) 20 MG TABS tablet Take 1 tablet (20 mg total) by mouth daily with supper. Patient not taking: Reported on 01/15/2017 03/22/16   Micheline Chapman, NP  simvastatin (ZOCOR) 40 MG tablet Take 1 tablet (40 mg total) by mouth at bedtime. Patient not taking: Reported on 05/18/2017 02/21/17   Scot Jun, FNP    Past Medical, Surgical Family and Social History reviewed and updated.    Objective:   Vitals:   05/18/17 1057  BP: 140/80  Pulse: 67  Resp: 16  Temp: 97.7 F (36.5 C)  SpO2: 100%    Today's Vitals   05/18/17 1057  Weight: 231 lb (104.8 kg)  Height: 5\' 8"  (1.727 m)    Wt Readings from Last 3 Encounters:  05/18/17 231 lb (104.8 kg)  01/15/17 230 lb (104.3 kg)  10/09/16 227 lb  12.8 oz (103.3 kg)   Physical Exam  Constitutional: He is oriented to person, place, and time. He appears well-developed and well-nourished.  HENT:  Head: Normocephalic and atraumatic.  Right Ear: External ear normal.  Left Ear: External ear normal.  Eyes: Pupils are equal, round, and reactive to light. Conjunctivae are normal.  Neck: Normal range of motion. Neck supple. No thyromegaly present.  Cardiovascular: Normal rate, regular rhythm, normal heart sounds and intact distal pulses.   Pulmonary/Chest: Effort normal and breath sounds normal.  Abdominal: Soft. Bowel sounds are normal.  Lymphadenopathy:    He has no cervical adenopathy.  Neurological: He is alert and oriented to person, place, and time.  Skin: Skin is warm and dry.  Psychiatric: He has a normal mood and affect. His behavior is  normal. Judgment and thought content normal.   Assessment & Plan:  1. Encounter for physical examination related to employment, form completed and faxed to employer. 2. Screening for diabetes mellitus, - POCT glycosylated hemoglobin (Hb A1C), 5.3 3. Need for influenza vaccination- Flu Vaccine QUAD 36+ mos IM  RTC: Recheck lipid panel in 3 months and 6 months wellness visit.   Carroll Sage. Kenton Kingfisher, MSN, FNP-C [facility]  [address] [place], [place] [address] [phone]

## 2017-08-16 ENCOUNTER — Other Ambulatory Visit: Payer: 59

## 2017-08-16 ENCOUNTER — Other Ambulatory Visit: Payer: Self-pay | Admitting: Family Medicine

## 2017-08-16 DIAGNOSIS — E785 Hyperlipidemia, unspecified: Secondary | ICD-10-CM

## 2017-08-16 NOTE — Progress Notes (Signed)
Added lipid panel.

## 2017-08-17 ENCOUNTER — Telehealth: Payer: Self-pay

## 2017-08-17 ENCOUNTER — Other Ambulatory Visit: Payer: Self-pay | Admitting: Family Medicine

## 2017-08-17 ENCOUNTER — Other Ambulatory Visit: Payer: 59

## 2017-08-17 LAB — LIPID PANEL
CHOL/HDL RATIO: 3.3 ratio (ref 0.0–5.0)
CHOLESTEROL TOTAL: 139 mg/dL (ref 100–199)
HDL: 42 mg/dL (ref >39)
LDL CALC: 84 mg/dL (ref 0–99)
Triglycerides: 63 mg/dL (ref 0–149)
VLDL CHOLESTEROL CAL: 13 mg/dL (ref 5–40)

## 2017-08-17 NOTE — Telephone Encounter (Signed)
Medication already sent into pharmacy today

## 2017-11-09 ENCOUNTER — Ambulatory Visit (INDEPENDENT_AMBULATORY_CARE_PROVIDER_SITE_OTHER): Payer: 59 | Admitting: Family Medicine

## 2017-11-09 ENCOUNTER — Encounter: Payer: Self-pay | Admitting: Family Medicine

## 2017-11-09 VITALS — BP 132/74 | HR 68 | Temp 98.0°F | Ht 68.0 in | Wt 230.0 lb

## 2017-11-09 DIAGNOSIS — E669 Obesity, unspecified: Secondary | ICD-10-CM

## 2017-11-09 DIAGNOSIS — K219 Gastro-esophageal reflux disease without esophagitis: Secondary | ICD-10-CM | POA: Diagnosis not present

## 2017-11-09 DIAGNOSIS — E785 Hyperlipidemia, unspecified: Secondary | ICD-10-CM | POA: Diagnosis not present

## 2017-11-09 MED ORDER — SIMVASTATIN 40 MG PO TABS: 40.0000 mg | ORAL_TABLET | Freq: Every day | ORAL | 2 refills | Status: DC

## 2017-11-09 MED ORDER — RANITIDINE HCL 75 MG PO TABS: 75.0000 mg | ORAL_TABLET | Freq: Two times a day (BID) | ORAL | 1 refills | Status: DC | PRN

## 2017-11-09 NOTE — Patient Instructions (Signed)

## 2017-11-09 NOTE — Progress Notes (Signed)
Patient ID: Tommy Walters, male    DOB: May 07, 1971, 47 y.o.   MRN: [id number]  PCP: Scot Jun, FNP  Chief Complaint  Patient presents with  . Follow-up    6 month wellness    Subjective:  HPI  Tommy Walters is a 47 y.o. male presents for 6 month follow up. He was last seen in office in October for his annual physical exam. He is in noted distress or discomfort. He denies any chest pain, palpitations, or and shortness of breath. He denies any lightheadedness, blurred vision or dizziness. He endorses a "squeaky noise from chest" when laying down at times, but is resolved when he takes zantac, He denies any other GI or Respiratory symptoms associated with his complaint. He his currently fasting and will have a lipid panel repeated today.   Social History   Socioeconomic History  . Marital status: Married    Spouse name: [name]  . Number of children: 4  . Years of education: MS  . Highest education level: Not on file  Occupational History  . Occupation: Ship broker  . Financial resource strain: Not on file  . Food insecurity:    Worry: Not on file    Inability: Not on file  . Transportation needs:    Medical: Not on file    Non-medical: Not on file  Tobacco Use  . Smoking status: Never Smoker  . Smokeless tobacco: Never Used  Substance and Sexual Activity  . Alcohol use: Yes    Alcohol/week: 0.6 oz    Types: 1 Glasses of wine per week    Comment: occ.   . Drug use: No  . Sexual activity: Yes  Lifestyle  . Physical activity:    Days per week: Not on file    Minutes per session: Not on file  . Stress: Not on file  Relationships  . Social connections:    Talks on phone: Not on file    Gets together: Not on file    Attends religious service: Not on file    Active member of club or organization: Not on file    Attends meetings of clubs or organizations: Not on file    Relationship status: Not on file  . Intimate partner violence:     Fear of current or ex partner: Not on file    Emotionally abused: Not on file    Physically abused: Not on file    Forced sexual activity: Not on file  Other Topics Concern  . Not on file  Social History Narrative   Lives with wife and 3 daughters.   Son is in school elsewhere.    Family History  Problem Relation Age of Onset  . Hypertension Mother   . Diabetes Mother   . Stroke Mother 27       died from      Review of Systems  Constitutional: Negative.   Respiratory: Negative.   Cardiovascular: Negative.   Gastrointestinal: Negative.   Neurological: Negative.     Patient Active Problem List   Diagnosis Date Noted  . DVT (deep venous thrombosis) ([facility]) 03/15/2016    Allergies  Allergen Reactions  . Aspirin Other (See Comments)    Too much causes blood in stool.  . Caffeine Other (See Comments)    GI upset  . Cherry Nausea And Vomiting    Prior to Admission medications   Medication Sig Start Date End Date Taking? Authorizing Provider  ranitidine (  ZANTAC) 75 MG tablet Take 1 tablet (75 mg total) by mouth 2 (two) times daily as needed for heartburn. 11/09/17 02/07/18 Yes Scot Jun, FNP  simvastatin (ZOCOR) 40 MG tablet Take 1 tablet (40 mg total) by mouth at bedtime. 11/09/17  Yes Scot Jun, FNP  acetaminophen (TYLENOL) 500 MG tablet Take 1,000 mg by mouth every 6 (six) hours as needed for mild pain, fever or headache.    [provider]  hydrocortisone (ANUSOL-HC) 2.5 % rectal cream Apply rectally 2 times daily Patient not taking: Reported on 01/15/2017 07/17/16   Micheline Chapman, NP  rivaroxaban (XARELTO) 20 MG TABS tablet Take 1 tablet (20 mg total) by mouth daily with supper. Patient not taking: Reported on 01/15/2017 03/22/16   Micheline Chapman, NP    Past Medical, Surgical Family and Social History reviewed and updated.    Objective:   Today's Vitals   11/09/17 1424  BP: 132/74  Pulse: 68  Temp: 98 F (36.7 C)  TempSrc: Oral   SpO2: 98%  Weight: 230 lb (104.3 kg)  Height: 5\' 8"  (1.727 m)    Wt Readings from Last 3 Encounters:  11/09/17 230 lb (104.3 kg)  05/18/17 231 lb (104.8 kg)  01/15/17 230 lb (104.3 kg)    Physical Exam  Constitutional: He is oriented to person, place, and time. He appears well-developed and well-nourished.  HENT:  Head: Normocephalic.  Cardiovascular: Normal rate, regular rhythm and normal heart sounds.  Pulmonary/Chest: Effort normal and breath sounds normal.  Neurological: He is alert and oriented to person, place, and time.  Skin: Skin is warm and dry.  Psychiatric: He has a normal mood and affect. His behavior is normal. Judgment and thought content normal.           Assessment & Plan:  1. Hyperlipidemia, unspecified hyperlipidemia type - Lipid Panel  2. Class 2 obesity in adult, unspecified BMI, unspecified obesity type, unspecified whether serious comorbidity present    If symptoms worsen or do not improve, return for follow-up, follow-up with PCP, or at the emergency department if severity of symptoms warrant a higher level of care. Festus Aloe, FNP

## 2017-11-09 NOTE — Progress Notes (Signed)
Patient ID: Tommy Walters, male    DOB: 05-05-1971, 47 y.o.   MRN: [id number]  PCP: Tommy Jun, FNP  Chief Complaint  Patient presents with  . Follow-up    6 month wellness    Subjective:  HPI Tommy Walters is a 47 y.o. male with a history of DVT, obesity, and hyperlipidemia presents for routine wellness visit. Tommy Walters reports overall good health. He has recently developed symptoms of acid reflux which he has manages  with zantac. He is trying to maintain weight and remains active of physical activity. Current Body mass index is 34.97 kg/m. He continues efforts to make good food choices. Denies chest pain, shortness of breath, cough, edema, headache, or new weakness. He is fasting today for labs. Social History   Socioeconomic History  . Marital status: Married    Spouse name: Tommy Walters  . Number of children: 4  . Years of education: MS  . Highest education level: Not on file  Occupational History  . Occupation: Ship broker  . Financial resource strain: Not on file  . Food insecurity:    Worry: Not on file    Inability: Not on file  . Transportation needs:    Medical: Not on file    Non-medical: Not on file  Tobacco Use  . Smoking status: Never Smoker  . Smokeless tobacco: Never Used  Substance and Sexual Activity  . Alcohol use: Yes    Alcohol/week: 0.6 oz    Types: 1 Glasses of wine per week    Comment: occ.   . Drug use: No  . Sexual activity: Yes  Lifestyle  . Physical activity:    Days per week: Not on file    Minutes per session: Not on file  . Stress: Not on file  Relationships  . Social connections:    Talks on phone: Not on file    Gets together: Not on file    Attends religious service: Not on file    Active member of club or organization: Not on file    Attends meetings of clubs or organizations: Not on file    Relationship status: Not on file  . Intimate partner violence:    Fear of current or ex partner: Not on file     Emotionally abused: Not on file    Physically abused: Not on file    Forced sexual activity: Not on file  Other Topics Concern  . Not on file  Social History Narrative   Lives with wife and 3 daughters.   Son is in school elsewhere.    Family History  Problem Relation Age of Onset  . Hypertension Mother   . Diabetes Mother   . Stroke Mother 8       died from    Review of Systems Pertinent negatives listed in HPI  Patient Active Problem List   Diagnosis Date Noted  . DVT (deep venous thrombosis) ([place]) 03/15/2016    Allergies  Allergen Reactions  . Aspirin Other (See Comments)    Too much causes blood in stool.  . Caffeine Other (See Comments)    GI upset  . Cherry Nausea And Vomiting    Prior to Admission medications   Medication Sig Start Date End Date Taking? Authorizing Provider  ranitidine (ZANTAC) 75 MG tablet Take 75 mg by mouth 2 (two) times daily as needed for heartburn.   Yes [provider]  simvastatin (ZOCOR) 40 MG tablet Take 1  tablet (40 mg total) by mouth at bedtime. 05/18/17  Yes Tommy Jun, FNP  acetaminophen (TYLENOL) 500 MG tablet Take 1,000 mg by mouth every 6 (six) hours as needed for mild pain, fever or headache.    [provider]  hydrocortisone (ANUSOL-HC) 2.5 % rectal cream Apply rectally 2 times daily Patient not taking: Reported on 01/15/2017 07/17/16   Micheline Chapman, NP  rivaroxaban (XARELTO) 20 MG TABS tablet Take 1 tablet (20 mg total) by mouth daily with supper. Patient not taking: Reported on 01/15/2017 03/22/16   Micheline Chapman, NP    Past Medical, Surgical Family and Social History reviewed and updated.    Objective:   Today's Vitals   11/09/17 1424  BP: 132/74  Pulse: 68  Temp: 98 F (36.7 C)  TempSrc: Oral  SpO2: 98%  Weight: 230 lb (104.3 kg)  Height: 5\' 8"  (1.727 m)    Wt Readings from Last 3 Encounters:  11/09/17 230 lb (104.3 kg)  05/18/17 231 lb (104.8 kg)  01/15/17 230 lb (104.3 kg)     Physical Exam Constitutional: Patient appears well-developed and well-nourished. No distress. HENT: Normocephalic, atraumatia. Eyes: Conjunctivae and EOM are normal. PERRLA, no scleral icterus. Neck: Normal ROM. Neck supple. No JVD. No tracheal deviation. No thyromegaly. CVS: RRR, S1/S2 +, no murmurs, no gallops, no carotid bruit.  Pulmonary: Effort and breath sounds normal, no stridor, rhonchi, wheezes, rales.  Musculoskeletal: Normal range of motion. No edema and no tenderness.  Neuro: Alert. Normal reflexes, muscle tone coordination. No cranial nerve deficit. Skin: Skin is warm and dry. No rash noted. Not diaphoretic. No erythema. No pallor. Psychiatric: Normal mood and affect. Behavior, judgment, thought content normal.   Assessment & Plan:  1. Hyperlipidemia, unspecified hyperlipidemia type, has remained well-controlled with statin therapy 2. Class 2 obesity in adult, unspecified BMI, unspecified obesity type, unspecified whether serious comorbidity present 3. Acid Reflux, continue ranitidine 75 mg BID as needed for symptoms.    Will repeat Lipid panel today as patient fasting. Lipid panel has remained stable since patient has consistently taken statin therapy and implemented exercise. No changes or addition to medication regimen today.   . RTC: PRN and for annual physical in the fall.   Tommy Walters. Tommy Kingfisher, MSN, FNP-C [facility]  [address] [place], [place] [address] [phone]

## 2017-11-10 LAB — LIPID PANEL
CHOL/HDL RATIO: 2.8 ratio (ref 0.0–5.0)
Cholesterol, Total: 162 mg/dL (ref 100–199)
HDL: 57 mg/dL (ref >39)
LDL CALC: 93 mg/dL (ref 0–99)
Triglycerides: 60 mg/dL (ref 0–149)
VLDL Cholesterol Cal: 12 mg/dL (ref 5–40)

## 2017-11-11 ENCOUNTER — Encounter: Payer: Self-pay | Admitting: Family Medicine

## 2017-11-11 NOTE — Progress Notes (Signed)
Mail lab letter  

## 2017-11-16 ENCOUNTER — Ambulatory Visit: Payer: 59 | Admitting: Family Medicine

## 2018-01-11 ENCOUNTER — Ambulatory Visit: Payer: 59 | Admitting: Family Medicine

## 2018-03-11 ENCOUNTER — Ambulatory Visit (INDEPENDENT_AMBULATORY_CARE_PROVIDER_SITE_OTHER): Payer: 59 | Admitting: Family Medicine

## 2018-03-11 ENCOUNTER — Encounter: Payer: Self-pay | Admitting: Family Medicine

## 2018-03-11 VITALS — BP 124/76 | HR 80 | Temp 98.4°F | Ht 68.0 in | Wt 230.0 lb

## 2018-03-11 DIAGNOSIS — R52 Pain, unspecified: Secondary | ICD-10-CM

## 2018-03-11 DIAGNOSIS — K219 Gastro-esophageal reflux disease without esophagitis: Secondary | ICD-10-CM | POA: Diagnosis not present

## 2018-03-11 DIAGNOSIS — Z131 Encounter for screening for diabetes mellitus: Secondary | ICD-10-CM | POA: Diagnosis not present

## 2018-03-11 DIAGNOSIS — Z86718 Personal history of other venous thrombosis and embolism: Secondary | ICD-10-CM

## 2018-03-11 DIAGNOSIS — E785 Hyperlipidemia, unspecified: Secondary | ICD-10-CM

## 2018-03-11 DIAGNOSIS — Z09 Encounter for follow-up examination after completed treatment for conditions other than malignant neoplasm: Secondary | ICD-10-CM

## 2018-03-11 LAB — POCT URINALYSIS DIP (MANUAL ENTRY)
Bilirubin, UA: NEGATIVE
Blood, UA: NEGATIVE
Glucose, UA: NEGATIVE mg/dL
Ketones, POC UA: NEGATIVE mg/dL
Leukocytes, UA: NEGATIVE
Nitrite, UA: NEGATIVE
Protein Ur, POC: NEGATIVE mg/dL
Spec Grav, UA: 1.02 (ref 1.010–1.025)
Urobilinogen, UA: 0.2 E.U./dL
pH, UA: 7 (ref 5.0–8.0)

## 2018-03-11 LAB — POCT GLYCOSYLATED HEMOGLOBIN (HGB A1C): Hemoglobin A1C: 5.4 % (ref 4.0–5.6)

## 2018-03-11 MED ORDER — SIMVASTATIN 40 MG PO TABS: 40.0000 mg | ORAL_TABLET | Freq: Every day | ORAL | 5 refills | Status: DC

## 2018-03-11 MED ORDER — ACETAMINOPHEN 500 MG PO TABS: 1000.0000 mg | ORAL_TABLET | Freq: Four times a day (QID) | ORAL | 5 refills | Status: AC | PRN

## 2018-03-11 MED ORDER — RANITIDINE HCL 75 MG PO TABS: 75.0000 mg | ORAL_TABLET | Freq: Two times a day (BID) | ORAL | 5 refills | Status: DC | PRN

## 2018-03-11 NOTE — Progress Notes (Signed)
Follow Up  Subjective:    Patient ID: Tommy Walters, male    DOB: 24-Oct-1970, 47 y.o.   MRN: [id number]   Chief Complaint  Patient presents with  . Follow-up    health maintence    HPI  Tommy Walters has a past history of DVT, GERD, and Hyperlipidemia.  Current Status: Since his last office visit, he is doing well with no complaints. He denies fevers, chills, fatigue, recent infections, weight loss, and night sweats. He has not had any visual changes, dizziness, and falls. No chest pain, heart palpitations, cough and shortness of breath reported. No reports of GI problems such as nausea, vomiting, diarrhea, and constipation. He has no reports of blood in stools, dysuria and hematuria. No depression or anxiety, and denies suicidal ideations, homicidal ideations, or auditory hallucinations. He denies pain today.    Past Medical History:  Diagnosis Date  . DVT (deep venous thrombosis) ([facility])     Family History  Problem Relation Age of Onset  . Hypertension Mother   . Diabetes Mother   . Stroke Mother 90       died from     Social History   Socioeconomic History  . Marital status: Married    Spouse name: [name]  . Number of children: 4  . Years of education: MS  . Highest education level: Not on file  Occupational History  . Occupation: Ship broker  . Financial resource strain: Not on file  . Food insecurity:    Worry: Not on file    Inability: Not on file  . Transportation needs:    Medical: Not on file    Non-medical: Not on file  Tobacco Use  . Smoking status: Never Smoker  . Smokeless tobacco: Never Used  Substance and Sexual Activity  . Alcohol use: Yes    Alcohol/week: 0.6 oz    Types: 1 Glasses of wine per week    Comment: occ.   . Drug use: No  . Sexual activity: Yes  Lifestyle  . Physical activity:    Days per week: Not on file    Minutes per session: Not on file  . Stress: Not on file  Relationships  . Social connections:     Talks on phone: Not on file    Gets together: Not on file    Attends religious service: Not on file    Active member of club or organization: Not on file    Attends meetings of clubs or organizations: Not on file    Relationship status: Not on file  . Intimate partner violence:    Fear of current or ex partner: Not on file    Emotionally abused: Not on file    Physically abused: Not on file    Forced sexual activity: Not on file  Other Topics Concern  . Not on file  Social History Narrative   Lives with wife and 3 daughters.   Son is in school elsewhere.    History reviewed. No pertinent surgical history.   Immunization History  Administered Date(s) Administered  . Influenza,inj,Quad PF,6+ Mos 06/14/2016, 05/18/2017  . Tdap 07/17/2016    Current Meds  Medication Sig  . acetaminophen (TYLENOL) 500 MG tablet Take 2 tablets (1,000 mg total) by mouth every 6 (six) hours as needed for mild pain, fever or headache.  . simvastatin (ZOCOR) 40 MG tablet Take 1 tablet (40 mg total) by mouth at bedtime.  . [DISCONTINUED] acetaminophen (TYLENOL) 500  MG tablet Take 1,000 mg by mouth every 6 (six) hours as needed for mild pain, fever or headache.  . [DISCONTINUED] simvastatin (ZOCOR) 40 MG tablet Take 1 tablet (40 mg total) by mouth at bedtime.    Allergies  Allergen Reactions  . Aspirin Other (See Comments)    Too much causes blood in stool.  . Caffeine Other (See Comments)    GI upset  . Cherry Nausea And Vomiting    BP 124/76 (BP Location: Left Arm, Patient Position: Sitting, Cuff Size: Large)   Pulse 80   Temp 98.4 F (36.9 C) (Oral)   Ht 5\' 8"  (1.727 m)   Wt 230 lb (104.3 kg)   SpO2 98%   BMI 34.97 kg/m     Review of Systems  Constitutional: Negative.   HENT: Negative.   Eyes: Negative.   Respiratory: Negative.   Cardiovascular: Negative.   Gastrointestinal: Negative.   Endocrine: Negative.   Genitourinary: Negative.   Musculoskeletal: Positive for back pain.   Skin: Negative.   Allergic/Immunologic: Negative.   Neurological: Positive for headaches (Occasional).  Hematological: Negative.   Psychiatric/Behavioral: Negative.     Objective:   Physical Exam  Constitutional: He is oriented to person, place, and time. He appears well-developed and well-nourished.  HENT:  Head: Normocephalic and atraumatic.  Right Ear: External ear normal.  Left Ear: External ear normal.  Nose: Nose normal.  Mouth/Throat: Oropharynx is clear and moist.  Eyes: Pupils are equal, round, and reactive to light. Conjunctivae and EOM are normal.  Neck: Normal range of motion. Neck supple.  Cardiovascular: Normal rate, regular rhythm, normal heart sounds and intact distal pulses.  Pulmonary/Chest: Effort normal and breath sounds normal.  Abdominal: Soft. Bowel sounds are normal.  Musculoskeletal: Normal range of motion.  Limited ROM in back.  Neurological: He is alert and oriented to person, place, and time.  Skin: Skin is warm and dry. Capillary refill takes less than 2 seconds.  Psychiatric: He has a normal mood and affect. His behavior is normal. Judgment and thought content normal.  Nursing note and vitals reviewed.  Assessment & Plan:   1. Screening for diabetes mellitus Hgb A1c is at normal level of 5.4 today.  He will continue to decrease foods/beverages high in sugars and carbs and follow Heart Healthy or DASH diet. Increase physical activity to at least 30 minutes cardio exercise daily.   - POCT glycosylated hemoglobin (Hb A1C) - POCT urinalysis dipstick  2. Hyperlipidemia, unspecified hyperlipidemia type Lipid panel normal on 11/09/2017. He will continue Zocor as prescribed.   - simvastatin (ZOCOR) 40 MG tablet; Take 1 tablet (40 mg total) by mouth at bedtime.  Dispense: 30 tablet; Refill: 5  3. Gastroesophageal reflux disease without esophagitis - ranitidine (ZANTAC) 75 MG tablet; Take 1 tablet (75 mg total) by mouth 2 (two) times daily as needed for  heartburn.  Dispense: 30 tablet; Refill: 5  4. Pain He reports no pain today.  - acetaminophen (TYLENOL) 500 MG tablet; Take 2 tablets (1,000 mg total) by mouth every 6 (six) hours as needed for mild pain, fever or headache.  Dispense: 30 tablet; Refill: 5  5. History of recurrent deep vein thrombosis (DVT) He has history of DVT in 2011 and 2017. He completed course of Xarelto. He reports no signs and symptoms of DVT like leg pain, warmth and erythem in legs, and shortness of breath. He will report to office if symptoms appear.   6. Follow up He will follow up in  3 months.   Meds ordered this encounter  Medications  . acetaminophen (TYLENOL) 500 MG tablet    Sig: Take 2 tablets (1,000 mg total) by mouth every 6 (six) hours as needed for mild pain, fever or headache.    Dispense:  30 tablet    Refill:  5  . simvastatin (ZOCOR) 40 MG tablet    Sig: Take 1 tablet (40 mg total) by mouth at bedtime.    Dispense:  30 tablet    Refill:  5  . ranitidine (ZANTAC) 75 MG tablet    Sig: Take 1 tablet (75 mg total) by mouth 2 (two) times daily as needed for heartburn.    Dispense:  30 tablet    Refill:  [place],  MSN, [facility] [address] [place],  [address] [phone]

## 2018-05-10 ENCOUNTER — Ambulatory Visit: Payer: 59 | Admitting: Family Medicine

## 2018-06-21 ENCOUNTER — Encounter: Payer: Self-pay | Admitting: Family Medicine

## 2018-06-21 ENCOUNTER — Ambulatory Visit (INDEPENDENT_AMBULATORY_CARE_PROVIDER_SITE_OTHER): Payer: Self-pay | Admitting: Family Medicine

## 2018-06-21 VITALS — BP 144/68 | HR 68 | Temp 97.4°F | Ht 68.0 in | Wt 225.0 lb

## 2018-06-21 DIAGNOSIS — K219 Gastro-esophageal reflux disease without esophagitis: Secondary | ICD-10-CM

## 2018-06-21 DIAGNOSIS — Z23 Encounter for immunization: Secondary | ICD-10-CM

## 2018-06-21 DIAGNOSIS — Z09 Encounter for follow-up examination after completed treatment for conditions other than malignant neoplasm: Secondary | ICD-10-CM

## 2018-06-21 DIAGNOSIS — Z86718 Personal history of other venous thrombosis and embolism: Secondary | ICD-10-CM

## 2018-06-21 DIAGNOSIS — E785 Hyperlipidemia, unspecified: Secondary | ICD-10-CM

## 2018-06-21 LAB — POCT URINALYSIS DIP (MANUAL ENTRY)
Bilirubin, UA: NEGATIVE
Blood, UA: NEGATIVE
Glucose, UA: NEGATIVE mg/dL
Ketones, POC UA: NEGATIVE mg/dL
Leukocytes, UA: NEGATIVE
Nitrite, UA: NEGATIVE
Protein Ur, POC: NEGATIVE mg/dL
Spec Grav, UA: 1.02 (ref 1.010–1.025)
Urobilinogen, UA: 0.2 E.U./dL
pH, UA: 6.5 (ref 5.0–8.0)

## 2018-06-21 MED ORDER — FAMOTIDINE 20 MG PO TABS: 20.0000 mg | ORAL_TABLET | Freq: Two times a day (BID) | ORAL | 1 refills | Status: DC

## 2018-06-21 NOTE — Progress Notes (Signed)
Follow Up  Subjective:    Patient ID: Tommy Walters, male    DOB: September 17, 1970, 47 y.o.   MRN: [id number]  Chief Complaint  Patient presents with  . Follow-up    chronic condition   HPI Tommy Walters is 47 year old male with a past medical history of DVT. He is her for follow up.   Current Status: Since his last office visit, he is doing well with no complaints. He is planning to travel home to [place] on 07/23/2018 and to return on 08/21/2018. He has a past history of lower extremity DVT. He was treated with anticoagulant, which was discontinued. He denies swelling, tenderness, erythema, and pain of lower extremities. Denies chest pain, heart palpitations, cough and shortness of breath.  He denies fevers, chills, fatigue, recent infections, weight loss, and night sweats. She has not had any headaches, visual changes, dizziness, and falls. No reports of GI problems such as nausea, vomiting, diarrhea, and constipation. He has no reports of blood in stools, dysuria and hematuria. No depression or anxiety, and denies suicidal ideations, homicidal ideations, or auditory hallucinations. He denies pain today.   Review of Systems  Constitutional: Negative.   Eyes: Negative.   Respiratory: Negative.   Cardiovascular: Negative.   Gastrointestinal: Positive for abdominal distention (Obese).  Endocrine: Negative.   Genitourinary: Negative.   Musculoskeletal: Negative.   Skin: Negative.   Allergic/Immunologic: Negative.   Neurological: Negative.   Hematological: Negative.   Psychiatric/Behavioral: Negative.    Objective:   Physical Exam  Constitutional: He is oriented to person, place, and time. He appears well-developed and well-nourished.  HENT:  Head: Normocephalic and atraumatic.  Eyes: Pupils are equal, round, and reactive to light. EOM are normal.  Neck: Normal range of motion. Neck supple.  Cardiovascular: Normal rate, regular rhythm, normal heart sounds and intact distal pulses.   Pulmonary/Chest: Effort normal and breath sounds normal.  Abdominal: Soft. Bowel sounds are normal.  Musculoskeletal: Normal range of motion.  Neurological: He is alert and oriented to person, place, and time.  Skin: Skin is warm and dry.  Psychiatric: He has a normal mood and affect. His behavior is normal. Judgment and thought content normal.  Nursing note and vitals reviewed.   Assessment & Plan:   1. Hyperlipidemia, unspecified hyperlipidemia type  2. Gastroesophageal reflux disease without esophagitis Discontinue Ranitidine and to begin Pepcid.  - famotidine (PEPCID) 20 MG tablet; Take 1 tablet (20 mg total) by mouth 2 (two) times daily.  Dispense: 90 tablet; Refill: 1  3. History of DVT (deep vein thrombosis) He will be traveling to [place] next month. We will evaluate need to re-start anticoagulant for prevention of DVTs.   4. Need for immunization against influenza - Flu Vaccine QUAD 36+ mos IM  5. Follow up He will follow up in 4 months.  - POCT urinalysis dipstick  Meds ordered this encounter  Medications  . famotidine (PEPCID) 20 MG tablet    Sig: Take 1 tablet (20 mg total) by mouth 2 (two) times daily.    Dispense:  90 tablet    Refill:  1

## 2018-06-24 ENCOUNTER — Telehealth: Payer: Self-pay

## 2018-06-24 ENCOUNTER — Other Ambulatory Visit: Payer: Self-pay | Admitting: Family Medicine

## 2018-06-24 DIAGNOSIS — E785 Hyperlipidemia, unspecified: Secondary | ICD-10-CM

## 2018-06-24 NOTE — Telephone Encounter (Signed)
Patient states that a new cholesterol medication was suppose to be sent into the pharmacy on Friday.

## 2018-06-25 NOTE — Telephone Encounter (Signed)
Left a vm for patient to callback 

## 2018-06-26 NOTE — Telephone Encounter (Signed)
Patient notified and Lanelle Bal will call patient regarding medication for out of country travel.

## 2018-07-01 ENCOUNTER — Other Ambulatory Visit: Payer: Self-pay | Admitting: Family Medicine

## 2018-07-04 ENCOUNTER — Telehealth: Payer: Self-pay

## 2018-07-04 NOTE — Telephone Encounter (Signed)
Patient states the name of the medication is Mefloquine 250mg  for yellow fever.

## 2018-07-05 ENCOUNTER — Other Ambulatory Visit: Payer: Self-pay | Admitting: Family Medicine

## 2018-07-05 DIAGNOSIS — Z86718 Personal history of other venous thrombosis and embolism: Secondary | ICD-10-CM

## 2018-07-05 MED ORDER — RIVAROXABAN 20 MG PO TABS: 20.0000 mg | ORAL_TABLET | Freq: Every day | ORAL | 3 refills | Status: DC

## 2018-07-05 NOTE — Telephone Encounter (Signed)
Patient states that the medication is for Malaria and would like to have it send in. Mefloquine 250mg .

## 2018-07-05 NOTE — Telephone Encounter (Signed)
-----   Message from Azzie Glatter, [place] sent at 07/05/2018  2:20 PM EST ----- Regarding: "Refill for DVT" Rx for Xarelto sent to pharmacy today.  After further research there is no know pill form for Yellow Fever. Please inform patient.   Thanks.

## 2018-07-05 NOTE — Progress Notes (Signed)
Rx for Xarelto sent to pharmacy today.

## 2018-07-08 ENCOUNTER — Other Ambulatory Visit: Payer: Self-pay | Admitting: Family Medicine

## 2018-07-08 DIAGNOSIS — Z86718 Personal history of other venous thrombosis and embolism: Secondary | ICD-10-CM

## 2018-07-08 DIAGNOSIS — T372X5A Adverse effect of antimalarials and drugs acting on other blood protozoa, initial encounter: Secondary | ICD-10-CM

## 2018-07-08 MED ORDER — RIVAROXABAN 20 MG PO TABS: 20.0000 mg | ORAL_TABLET | Freq: Every day | ORAL | 3 refills | Status: DC

## 2018-07-08 MED ORDER — ATOVAQUONE-PROGUANIL HCL 250-100 MG PO TABS: 1.0000 | ORAL_TABLET | Freq: Every day | ORAL | 0 refills | Status: DC

## 2018-07-08 NOTE — Progress Notes (Signed)
Rx for Malarone sent to pharmacy today.

## 2018-09-13 ENCOUNTER — Ambulatory Visit (INDEPENDENT_AMBULATORY_CARE_PROVIDER_SITE_OTHER): Payer: BLUE CROSS/BLUE SHIELD | Admitting: Family Medicine

## 2018-09-13 ENCOUNTER — Encounter: Payer: Self-pay | Admitting: Family Medicine

## 2018-09-13 VITALS — BP 142/88 | HR 70 | Temp 97.6°F | Ht 68.0 in | Wt 230.0 lb

## 2018-09-13 DIAGNOSIS — Z131 Encounter for screening for diabetes mellitus: Secondary | ICD-10-CM

## 2018-09-13 DIAGNOSIS — Z0000 Encounter for general adult medical examination without abnormal findings: Secondary | ICD-10-CM | POA: Diagnosis not present

## 2018-09-13 DIAGNOSIS — Z09 Encounter for follow-up examination after completed treatment for conditions other than malignant neoplasm: Secondary | ICD-10-CM

## 2018-09-13 DIAGNOSIS — Z1211 Encounter for screening for malignant neoplasm of colon: Secondary | ICD-10-CM

## 2018-09-13 DIAGNOSIS — Z125 Encounter for screening for malignant neoplasm of prostate: Secondary | ICD-10-CM | POA: Insufficient documentation

## 2018-09-13 DIAGNOSIS — Z86718 Personal history of other venous thrombosis and embolism: Secondary | ICD-10-CM

## 2018-09-13 DIAGNOSIS — E785 Hyperlipidemia, unspecified: Secondary | ICD-10-CM

## 2018-09-13 LAB — POCT URINALYSIS DIP (MANUAL ENTRY)
Bilirubin, UA: NEGATIVE
Blood, UA: NEGATIVE
Glucose, UA: NEGATIVE mg/dL
Ketones, POC UA: NEGATIVE mg/dL
Leukocytes, UA: NEGATIVE
Nitrite, UA: NEGATIVE
Protein Ur, POC: NEGATIVE mg/dL
Spec Grav, UA: 1.015 (ref 1.010–1.025)
Urobilinogen, UA: 0.2 E.U./dL
pH, UA: 7.5 (ref 5.0–8.0)

## 2018-09-13 LAB — POCT GLYCOSYLATED HEMOGLOBIN (HGB A1C): Hemoglobin A1C: 5.2 % (ref 4.0–5.6)

## 2018-09-13 NOTE — Progress Notes (Addendum)
[facility]  Sick Visit--Established Patient  Subjective:  Patient ID: Tommy Walters, male    DOB: [birth year]  Age: 48 y.o. MRN: [id number]  CC:  Chief Complaint  Patient presents with  . Medication Problem    HPI Tommy Walters is a 48 year old male who presents for Sick Visit today.   Past Medical History:  Diagnosis Date  . DVT (deep venous thrombosis) (HCC)    Current Status: Since his last office visit, he is doing well with no complaints. He does report mild left shoulder pain. He has returned from a recent trip to [place]. He has history of DVT. He wore compression stocking while on his trip. No pain, swelling, erythema, warmth, and tenderness of the effected limb.   He denies fevers, chills, fatigue, recent infections, weight loss, and night sweats. He has not had any headaches, visual changes, dizziness, and falls. No chest pain, heart palpitations, cough and shortness of breath reported. No reports of GI problems such as nausea, vomiting, diarrhea, and constipation. He has no reports of blood in stools, dysuria and hematuria. No depression or anxiety, and denies suicidal ideations, homicidal ideations, or auditory hallucinations. He denies pain today.   History reviewed. No pertinent surgical history.  Family History  Problem Relation Age of Onset  . Hypertension Mother   . Diabetes Mother   . Stroke Mother 32       died from     Social History   Socioeconomic History  . Marital status: Married    Spouse name: [name]  . Number of children: 4  . Years of education: MS  . Highest education level: Not on file  Occupational History  . Occupation: Ship broker  . Financial resource strain: Not on file  . Food insecurity:    Worry: Not on file    Inability: Not on file  . Transportation needs:    Medical: Not on file    Non-medical: Not on file  Tobacco Use  . Smoking status: Never Smoker    . Smokeless tobacco: Never Used  Substance and Sexual Activity  . Alcohol use: Yes    Alcohol/week: 1.0 standard drinks    Types: 1 Glasses of wine per week    Comment: occ.   . Drug use: No  . Sexual activity: Yes  Lifestyle  . Physical activity:    Days per week: Not on file    Minutes per session: Not on file  . Stress: Not on file  Relationships  . Social connections:    Talks on phone: Not on file    Gets together: Not on file    Attends religious service: Not on file    Active member of club or organization: Not on file    Attends meetings of clubs or organizations: Not on file    Relationship status: Not on file  . Intimate partner violence:    Fear of current or ex partner: Not on file    Emotionally abused: Not on file    Physically abused: Not on file    Forced sexual activity: Not on file  Other Topics Concern  . Not on file  Social History Narrative   Lives with wife and 3 daughters.   Son is in school elsewhere.    Outpatient Medications Prior to Visit  Medication Sig Dispense Refill  . acetaminophen (TYLENOL) 500 MG tablet Take 2 tablets (1,000 mg  total) by mouth every 6 (six) hours as needed for mild pain, fever or headache. 30 tablet 5  . famotidine (PEPCID) 20 MG tablet Take 1 tablet (20 mg total) by mouth 2 (two) times daily. 90 tablet 1  . rivaroxaban (XARELTO) 20 MG TABS tablet Take 1 tablet (20 mg total) by mouth daily with supper. 30 tablet 3  . simvastatin (ZOCOR) 40 MG tablet Take 1 tablet (40 mg total) by mouth at bedtime. 30 tablet 5  . atovaquone-proguanil (MALARONE) 250-100 MG TABS tablet Take 1 tablet by mouth daily. (Patient not taking: Reported on 09/13/2018) 30 tablet 0   No facility-administered medications prior to visit.     Allergies  Allergen Reactions  . Aspirin Other (See Comments)    Too much causes blood in stool.  . Caffeine Other (See Comments)    GI upset  . Cherry Nausea And Vomiting    ROS Review of Systems   Constitutional: Negative.   HENT: Negative.   Eyes: Negative.   Respiratory: Negative.   Cardiovascular: Negative.   Gastrointestinal: Negative.   Endocrine: Negative.   Genitourinary: Negative.   Musculoskeletal: Negative.   Skin: Negative.   Allergic/Immunologic: Negative.   Neurological: Negative.   Hematological: Negative.   Psychiatric/Behavioral: Negative.       Objective:    Physical Exam  Constitutional: He is oriented to person, place, and time. He appears well-developed and well-nourished.  HENT:  Head: Normocephalic and atraumatic.  Eyes: Conjunctivae are normal.  Neck: Normal range of motion. Neck supple.  Cardiovascular: Normal rate, regular rhythm, normal heart sounds and intact distal pulses.  Pulmonary/Chest: Effort normal and breath sounds normal.  Abdominal: Soft. Bowel sounds are normal.  Musculoskeletal: Normal range of motion.  Neurological: He is alert and oriented to person, place, and time. He has normal reflexes.  Skin: Skin is warm and dry.  Nursing note and vitals reviewed.   BP (!) 142/88 (BP Location: Right Arm, Patient Position: Sitting, Cuff Size: Large)   Pulse 70   Temp 97.6 F (36.4 C) (Oral)   Ht 5\' 8"  (1.727 m)   Wt 230 lb (104.3 kg)   SpO2 100%   BMI 34.97 kg/m  Wt Readings from Last 3 Encounters:  09/13/18 230 lb (104.3 kg)  06/21/18 225 lb (102.1 kg)  03/11/18 230 lb (104.3 kg)     Health Maintenance Due  Topic Date Due  . COLONOSCOPY  09/13/2018    There are no preventive care reminders to display for this patient.  Lab Results  Component Value Date   TSH 2.13 01/15/2017   Lab Results  Component Value Date   WBC 6.8 01/15/2017   HGB 13.7 01/15/2017   HCT 43.0 01/15/2017   MCV 83.5 01/15/2017   PLT 208 01/15/2017   Lab Results  Component Value Date   NA 141 01/15/2017   K 4.3 01/15/2017   CO2 22 01/15/2017   GLUCOSE 83 01/15/2017   BUN 15 01/15/2017   CREATININE 1.16 01/15/2017   BILITOT 0.3  01/15/2017   ALKPHOS 63 01/15/2017   AST 30 01/15/2017   ALT 41 01/15/2017   PROT 6.9 01/15/2017   ALBUMIN 3.9 01/15/2017   CALCIUM 8.7 01/15/2017   ANIONGAP 6 03/02/2016   Lab Results  Component Value Date   CHOL 162 11/09/2017   Lab Results  Component Value Date   HDL 57 11/09/2017   Lab Results  Component Value Date   LDLCALC 93 11/09/2017   Lab Results  Component  Value Date   TRIG 60 11/09/2017   Lab Results  Component Value Date   CHOLHDL 2.8 11/09/2017   Lab Results  Component Value Date   HGBA1C 5.2 09/13/2018   Assessment & Plan:   1. Hyperlipidemia, unspecified hyperlipidemia type Stable. We will re-evaluate Lipid Panel today. Continue Atorvastatin as prescribed.   2. History of DVT (deep vein thrombosis) Stable. No signs or symptoms of DVT noted or reported today. Continue Xarelto as prescribed.   3. Screening for diabetes mellitus Hgb A1c is within normal range of 5.2 today.  She will continue to decrease foods/beverages high in sugars and carbs and follow Heart Healthy or DASH diet. Increase physical activity to at least 30 minutes cardio exercise daily.  - POCT glycosylated hemoglobin (Hb A1C) - POCT urinalysis dipstick  4. Healthcare maintenance - Lipid Panel - CBC with Differential - Comprehensive metabolic panel; Future - TSH - Vitamin D, 25-hydroxy  5. Colon cancer screening - Ambulatory referral to Gastroenterology  6. Follow up He will keep follow up appointment for 12/2018. He will schedule for annual physical for next office visit.   No orders of the defined types were placed in this encounter.   Referral Orders     Ambulatory referral to Gastroenterology Problem List Items Addressed This Visit    None    Visit Diagnoses    Hyperlipidemia, unspecified hyperlipidemia type    -  Primary   History of DVT (deep vein thrombosis)       Screening for diabetes mellitus       Relevant Orders   POCT glycosylated hemoglobin (Hb A1C)  (Completed)   POCT urinalysis dipstick (Completed)   Healthcare maintenance       Relevant Orders   Lipid Panel   CBC with Differential   Comprehensive metabolic panel   TSH   Vitamin D, 25-hydroxy   Follow up       Colon cancer screening       Relevant Orders   Ambulatory referral to Gastroenterology      No orders of the defined types were placed in this encounter.   Follow-up: No follow-ups on file.    Azzie Glatter, FNP

## 2018-09-14 LAB — CBC WITH DIFFERENTIAL/PLATELET
Basophils Absolute: 0 10*3/uL (ref 0.0–0.2)
Basos: 1 %
EOS (ABSOLUTE): 0.4 10*3/uL (ref 0.0–0.4)
Eos: 6 %
Hematocrit: 46.4 % (ref 37.5–51.0)
Hemoglobin: 15.1 g/dL (ref 13.0–17.7)
Immature Grans (Abs): 0 10*3/uL (ref 0.0–0.1)
Immature Granulocytes: 0 %
Lymphocytes Absolute: 3 10*3/uL (ref 0.7–3.1)
Lymphs: 43 %
MCH: 27 pg (ref 26.6–33.0)
MCHC: 32.5 g/dL (ref 31.5–35.7)
MCV: 83 fL (ref 79–97)
Monocytes Absolute: 0.5 10*3/uL (ref 0.1–0.9)
Monocytes: 8 %
Neutrophils Absolute: 2.8 10*3/uL (ref 1.4–7.0)
Neutrophils: 42 %
Platelets: 192 10*3/uL (ref 150–450)
RBC: 5.59 x10E6/uL (ref 4.14–5.80)
RDW: 12.9 % (ref 11.6–15.4)
WBC: 6.7 10*3/uL (ref 3.4–10.8)

## 2018-09-14 LAB — LIPID PANEL
Chol/HDL Ratio: 3.7 ratio (ref 0.0–5.0)
Cholesterol, Total: 237 mg/dL — ABNORMAL HIGH (ref 100–199)
HDL: 64 mg/dL (ref >39)
LDL Calculated: 161 mg/dL — ABNORMAL HIGH (ref 0–99)
Triglycerides: 59 mg/dL (ref 0–149)
VLDL Cholesterol Cal: 12 mg/dL (ref 5–40)

## 2018-09-14 LAB — VITAMIN D 25 HYDROXY (VIT D DEFICIENCY, FRACTURES): Vit D, 25-Hydroxy: 23.3 ng/mL — ABNORMAL LOW (ref 30.0–100.0)

## 2018-09-14 LAB — TSH: TSH: 1.02 u[IU]/mL (ref 0.450–4.500)

## 2018-09-18 ENCOUNTER — Encounter: Payer: Self-pay | Admitting: Gastroenterology

## 2018-10-11 ENCOUNTER — Ambulatory Visit: Payer: BLUE CROSS/BLUE SHIELD | Admitting: Gastroenterology

## 2018-10-11 ENCOUNTER — Encounter: Payer: Self-pay | Admitting: Gastroenterology

## 2018-10-11 VITALS — BP 128/84 | HR 74 | Ht 68.0 in | Wt 233.6 lb

## 2018-10-11 DIAGNOSIS — Z1211 Encounter for screening for malignant neoplasm of colon: Secondary | ICD-10-CM

## 2018-10-11 NOTE — Patient Instructions (Signed)
If you are age 50 or older, your body mass index should be between 23-30. Your Body mass index is 35.52 kg/m. If this is out of the aforementioned range listed, please consider follow up with your Primary Care Provider.  If you are age 40 or younger, your body mass index should be between 19-25. Your Body mass index is 35.52 kg/m. If this is out of the aformentioned range listed, please consider follow up with your Primary Care Provider.   It has been recommended to you by your physician that you have a(n) Colonoscopy/LEC completed. Per your request, we did not schedule the procedure(s) today due to availability. Pt needs a Friday due to work schedule. Please contact our office at [phone] should you decide to have the procedure completed or we will contact you when we have April 2020 schedule available.   Thank you for choosing me and [facility].  Dr. Tarri Glenn

## 2018-10-11 NOTE — Progress Notes (Signed)
Referring Provider: Azzie Glatter, FNP Primary Care Physician:  Azzie Glatter, FNP   Reason for Consultation:  Need for colon cancer screening   IMPRESSION:  Need for colon cancer screening Intermittent rectal bleeding Recently on Plavix for DVT, does not use long-term  PLAN: Colonoscopy  I consented the patient at the bedside today discussing the risks, benefits, and alternatives to endoscopic evaluation. In particular, we discussed the risks that include, but are not limited to, reaction to medication, cardiopulmonary compromise, bleeding requiring blood transfusion, aspiration resulting in pneumonia, perforation requiring surgery, lack of diagnosis, severe illness requiring hospitalization, and even death. We reviewed the risk of missed lesion including polyps or even cancer. The patient acknowledges these risks and asks that we proceed.   HPI: Tommy Walters is a 48 y.o. who works in [employer] was referred for a colonoscopy. Originally from [place].    On Xarelto for a history of DVT.  Was just taking it recently given his history of DVT and his long travels back home.  Does not need long-term Xarelto therapy at this time.  He failed our open access program because of his recent Xarelto use.  Rectal bleeding when he has caffeine or ASA.  This bleeding does not bother him.  No rectal pain.  No straining.  No dairrhea or constipation.  No change in bowel habits.  No longer on Xarelto.  No prior endoscopic evaluation.  No known family history of colon cancer or polyps. Multiple complaints of stomach problems.   Past Medical History:  Diagnosis Date  . DVT (deep venous thrombosis) ([facility])     History reviewed. No pertinent surgical history.  Current Outpatient Medications  Medication Sig Dispense Refill  . acetaminophen (TYLENOL) 500 MG tablet Take 2 tablets (1,000 mg total) by mouth every 6 (six) hours as needed for mild pain, fever or headache. 30 tablet 5  .  atovaquone-proguanil (MALARONE) 250-100 MG TABS tablet Take 1 tablet by mouth daily. 30 tablet 0  . famotidine (PEPCID) 20 MG tablet Take 1 tablet (20 mg total) by mouth 2 (two) times daily. 90 tablet 1  . simvastatin (ZOCOR) 40 MG tablet Take 1 tablet (40 mg total) by mouth at bedtime. 30 tablet 5   No current facility-administered medications for this visit.     Allergies as of 10/11/2018 - Review Complete 10/11/2018  Allergen Reaction Noted  . Aspirin Other (See Comments) 03/02/2012  . Caffeine Other (See Comments) 03/02/2016  . Cherry Nausea And Vomiting 03/02/2016    Family History  Problem Relation Age of Onset  . Hypertension Mother   . Diabetes Mother   . Stroke Mother 69       died from     Social History   Socioeconomic History  . Marital status: Married    Spouse name: [name]  . Number of children: 4  . Years of education: MS  . Highest education level: Not on file  Occupational History  . Occupation: Ship broker  . Financial resource strain: Not on file  . Food insecurity:    Worry: Not on file    Inability: Not on file  . Transportation needs:    Medical: Not on file    Non-medical: Not on file  Tobacco Use  . Smoking status: Never Smoker  . Smokeless tobacco: Never Used  Substance and Sexual Activity  . Alcohol use: Yes    Alcohol/week: 1.0 standard drinks    Types: 1 Glasses of  wine per week    Comment: occ.   . Drug use: No  . Sexual activity: Yes  Lifestyle  . Physical activity:    Days per week: Not on file    Minutes per session: Not on file  . Stress: Not on file  Relationships  . Social connections:    Talks on phone: Not on file    Gets together: Not on file    Attends religious service: Not on file    Active member of club or organization: Not on file    Attends meetings of clubs or organizations: Not on file    Relationship status: Not on file  . Intimate partner violence:    Fear of current or ex partner: Not  on file    Emotionally abused: Not on file    Physically abused: Not on file    Forced sexual activity: Not on file  Other Topics Concern  . Not on file  Social History Narrative   Lives with wife and 3 daughters.   Son is in school elsewhere.    Review of Systems: 12 system ROS is negative except as noted above.  Filed Weights   10/11/18 1528  Weight: 233 lb 9.6 oz (106 kg)    Physical Exam: Vital signs were reviewed. General:   Alert, well-nourished, pleasant and cooperative in NAD Head:  Normocephalic and atraumatic. Eyes:  Sclera clear, no icterus.   Conjunctiva pink. Mouth:  No deformity or lesions.   Neck:  Supple; no thyromegaly. Lungs:  Clear throughout to auscultation.   No wheezes.  Heart:  Regular rate and rhythm; no murmurs Abdomen:  Soft, nontender, normal bowel sounds. No rebound or guarding. No hepatosplenomegaly Rectal:  Deferred  Msk:  Symmetrical without gross deformities. Extremities:  No gross deformities or edema. Neurologic:  Alert and  oriented x4;  grossly nonfocal Skin:  No rash or bruise. Psych:  Alert and cooperative. Normal mood and affect.   Solara Goodchild L. Tarri Glenn, MD, MPH [facility] 10/24/2018, 1:32 PM

## 2018-10-24 ENCOUNTER — Encounter: Payer: Self-pay | Admitting: Gastroenterology

## 2018-11-25 ENCOUNTER — Other Ambulatory Visit: Payer: Self-pay | Admitting: Family Medicine

## 2018-11-25 DIAGNOSIS — E785 Hyperlipidemia, unspecified: Secondary | ICD-10-CM

## 2018-12-20 ENCOUNTER — Other Ambulatory Visit: Payer: Self-pay

## 2018-12-20 ENCOUNTER — Encounter: Payer: Self-pay | Admitting: Family Medicine

## 2018-12-20 ENCOUNTER — Ambulatory Visit (INDEPENDENT_AMBULATORY_CARE_PROVIDER_SITE_OTHER): Payer: BLUE CROSS/BLUE SHIELD | Admitting: Family Medicine

## 2018-12-20 VITALS — BP 144/88 | HR 66 | Temp 97.4°F | Ht 68.0 in | Wt 234.0 lb

## 2018-12-20 DIAGNOSIS — Z09 Encounter for follow-up examination after completed treatment for conditions other than malignant neoplasm: Secondary | ICD-10-CM

## 2018-12-20 DIAGNOSIS — M25512 Pain in left shoulder: Secondary | ICD-10-CM | POA: Diagnosis not present

## 2018-12-20 DIAGNOSIS — Z0000 Encounter for general adult medical examination without abnormal findings: Secondary | ICD-10-CM | POA: Diagnosis not present

## 2018-12-20 DIAGNOSIS — Z0100 Encounter for examination of eyes and vision without abnormal findings: Secondary | ICD-10-CM

## 2018-12-20 LAB — POCT URINALYSIS DIP (MANUAL ENTRY)
Bilirubin, UA: NEGATIVE
Blood, UA: NEGATIVE
Glucose, UA: NEGATIVE mg/dL
Ketones, POC UA: NEGATIVE mg/dL
Leukocytes, UA: NEGATIVE
Nitrite, UA: NEGATIVE
Protein Ur, POC: NEGATIVE mg/dL
Spec Grav, UA: 1.015 (ref 1.010–1.025)
Urobilinogen, UA: 0.2 E.U./dL
pH, UA: 6 (ref 5.0–8.0)

## 2018-12-20 NOTE — Progress Notes (Signed)
[facility]   Annual Physical  Subjective:  Patient ID: Tommy Walters, male    DOB: 1971/02/26  Age: 48 y.o. MRN: [id number]  CC:  Chief Complaint  Patient presents with  . Annual Exam  . Shoulder Pain    HPI COEN Tommy Walters is a 48 year old male who presents for his Annual Physical today.   Past Medical History:  Diagnosis Date  . DVT (deep venous thrombosis) (HCC)    Current Status: Since his last office visit, he is doing well with no complaints. He has history of DVT. He denies pain, swelling, erythema, warmth, and tenderness of the effected limb. He reports chronic left shoulder and arm pain. He states that he does not take any pain medication for relief.   He denies fevers, chills, fatigue, recent infections, weight loss, and night sweats. He has not had any headaches, visual changes, dizziness, and falls. No chest pain, heart palpitations, cough and shortness of breath reported. No reports of GI problems such as nausea, vomiting, diarrhea, and constipation. He has no reports of blood in stools, dysuria and hematuria. No depression or anxiety reported.   History reviewed. No pertinent surgical history.  Family History  Problem Relation Age of Onset  . Hypertension Mother   . Diabetes Mother   . Stroke Mother 93       died from     Social History   Socioeconomic History  . Marital status: Married    Spouse name: Tommy Walters  . Number of children: 4  . Years of education: MS  . Highest education level: Not on file  Occupational History  . Occupation: Ship broker  . Financial resource strain: Not on file  . Food insecurity:    Worry: Not on file    Inability: Not on file  . Transportation needs:    Medical: Not on file    Non-medical: Not on file  Tobacco Use  . Smoking status: Never Smoker  . Smokeless tobacco: Never Used  Substance and Sexual Activity  . Alcohol use: Yes    Alcohol/week:  1.0 standard drinks    Types: 1 Glasses of wine per week    Comment: occ.   . Drug use: No  . Sexual activity: Yes  Lifestyle  . Physical activity:    Days per week: Not on file    Minutes per session: Not on file  . Stress: Not on file  Relationships  . Social connections:    Talks on phone: Not on file    Gets together: Not on file    Attends religious service: Not on file    Active member of club or organization: Not on file    Attends meetings of clubs or organizations: Not on file    Relationship status: Not on file  . Intimate partner violence:    Fear of current or ex partner: Not on file    Emotionally abused: Not on file    Physically abused: Not on file    Forced sexual activity: Not on file  Other Topics Concern  . Not on file  Social History Narrative   Lives with wife and 3 daughters.   Son is in school elsewhere.    Outpatient Medications Prior to Visit  Medication Sig Dispense Refill  . acetaminophen (TYLENOL) 500 MG tablet Take 2 tablets (1,000 mg total) by mouth every 6 (six) hours as needed for mild pain,  fever or headache. 30 tablet 5  . famotidine (PEPCID) 20 MG tablet Take 1 tablet (20 mg total) by mouth 2 (two) times daily. 90 tablet 1  . simvastatin (ZOCOR) 40 MG tablet TAKE 1 TABLET BY MOUTH AT BEDTIME 90 tablet 0  . atovaquone-proguanil (MALARONE) 250-100 MG TABS tablet Take 1 tablet by mouth daily. (Patient not taking: Reported on 12/20/2018) 30 tablet 0   No facility-administered medications prior to visit.     Allergies  Allergen Reactions  . Aspirin Other (See Comments)    Too much causes blood in stool.  . Caffeine Other (See Comments)    GI upset  . Cherry Nausea And Vomiting    ROS Review of Systems  Constitutional: Negative.   HENT: Negative.   Eyes: Negative.   Respiratory: Negative.   Cardiovascular: Negative.   Gastrointestinal: Negative.   Endocrine: Negative.   Genitourinary: Negative.   Musculoskeletal: Positive for  arthralgias (left arm and shoulder pain).  Skin: Negative.   Allergic/Immunologic: Negative.   Neurological: Negative.   Hematological: Negative.   Psychiatric/Behavioral: Negative.    Objective:    Physical Exam  Constitutional: He is oriented to person, place, and time. He appears well-developed and well-nourished.  HENT:  Head: Normocephalic and atraumatic.  Eyes: Conjunctivae are normal.  Neck: Normal range of motion. Neck supple.  Cardiovascular: Normal rate, regular rhythm, normal heart sounds and intact distal pulses.  Pulmonary/Chest: Effort normal and breath sounds normal.  Abdominal: Soft. Bowel sounds are normal.  Musculoskeletal: Normal range of motion.  Neurological: He is alert and oriented to person, place, and time. He has normal reflexes.  Skin: Skin is warm and dry.  Psychiatric: He has a normal mood and affect. His behavior is normal. Judgment and thought content normal.  Nursing note and vitals reviewed.   BP (!) 144/88 (BP Location: Right Arm, Patient Position: Sitting, Cuff Size: Large)   Pulse 66   Temp (!) 97.4 F (36.3 C) (Oral)   Ht 5\' 8"  (1.727 m)   Wt 234 lb (106.1 kg)   SpO2 100%   BMI 35.58 kg/m  Wt Readings from Last 3 Encounters:  12/20/18 234 lb (106.1 kg)  10/11/18 233 lb 9.6 oz (106 kg)  09/13/18 230 lb (104.3 kg)     Health Maintenance Due  Topic Date Due  . COLONOSCOPY  09/13/2018    There are no preventive care reminders to display for this patient.  Lab Results  Component Value Date   TSH 1.020 09/13/2018   Lab Results  Component Value Date   WBC 6.7 09/13/2018   HGB 15.1 09/13/2018   HCT 46.4 09/13/2018   MCV 83 09/13/2018   PLT 192 09/13/2018   Lab Results  Component Value Date   NA 141 01/15/2017   K 4.3 01/15/2017   CO2 22 01/15/2017   GLUCOSE 83 01/15/2017   BUN 15 01/15/2017   CREATININE 1.16 01/15/2017   BILITOT 0.3 01/15/2017   ALKPHOS 63 01/15/2017   AST 30 01/15/2017   ALT 41 01/15/2017   PROT 6.9  01/15/2017   ALBUMIN 3.9 01/15/2017   CALCIUM 8.7 01/15/2017   ANIONGAP 6 03/02/2016   Lab Results  Component Value Date   CHOL 237 (H) 09/13/2018   Lab Results  Component Value Date   HDL 64 09/13/2018   Lab Results  Component Value Date   LDLCALC 161 (H) 09/13/2018   Lab Results  Component Value Date   TRIG 59 09/13/2018   Lab Results  Component Value Date   CHOLHDL 3.7 09/13/2018   Lab Results  Component Value Date   HGBA1C 5.2 09/13/2018      Assessment & Plan:   1. Annual physical exam Normal physical exam.  - POCT urinalysis dipstick  2. Left shoulder pain, unspecified chronicity Stable.   3. Encounter for visual field exam Right vision is 20/30 and Left vision is 20/35 today.   4. Follow up He will follow up in 6 months.   No orders of the defined types were placed in this encounter.  Orders Placed This Encounter  Procedures  . POCT urinalysis dipstick    Referral Orders  No referral(s) requested today    Kathe Becton,  MSN, FNP-C [facility] [address] [place], [place] [id number] [phone]   Problem List Items Addressed This Visit    None    Visit Diagnoses    Annual physical exam    -  Primary   Relevant Orders   POCT urinalysis dipstick (Completed)      No orders of the defined types were placed in this encounter.   Follow-up: No follow-ups on file.    Azzie Glatter, FNP

## 2018-12-20 NOTE — Patient Instructions (Signed)
Muscle Strain A muscle strain is an injury that happens when a muscle is stretched longer than normal. This can happen during a fall, sports, or lifting. This can tear some muscle fibers. Usually, recovery from muscle strain takes 1-2 weeks. Complete healing normally takes 5-6 weeks. This condition is first treated with PRICE therapy. This involves:  Protecting your muscle from being injured again.  Resting your injured muscle.  Icing your injured muscle.  Applying pressure (compression) to your injured muscle. This may be done with a splint or elastic bandage.  Raising (elevating) your injured muscle. Your doctor may also recommend medicine for pain. Follow these instructions at home: If you have a splint:  Wear the splint as told by your doctor. Take it off only as told by your doctor.  Loosen the splint if your fingers or toes tingle, get numb, or turn cold and blue.  Keep the splint clean.  If the splint is not waterproof: ? Do not let it get wet. ? Cover it with a watertight covering when you take a bath or a shower. Managing pain, stiffness, and swelling   If directed, put ice on your injured area. ? If you have a removable splint, take it off as told by your doctor. ? Put ice in a plastic bag. ? Place a towel between your skin and the bag. ? Leave the ice on for 20 minutes, 2-3 times a day.  Move your fingers or toes often. This helps to avoid stiffness and lessen swelling.  Raise your injured area above the level of your heart while you are sitting or lying down.  Wear an elastic bandage as told by your doctor. Make sure it is not too tight. General instructions  Take over-the-counter and prescription medicines only as told by your doctor.  Limit your activity. Rest your injured muscle as told by your doctor. Your doctor may say that gentle movements are okay.  If physical therapy was prescribed, do exercises as told by your doctor.  Do not put pressure on any  part of the splint until it is fully hardened. This may take many hours.  Do not use any products that contain nicotine or tobacco, such as cigarettes and e-cigarettes. These can delay bone healing. If you need help quitting, ask your doctor.  Warm up before you exercise. This helps to prevent more muscle strains.  Ask your doctor when it is safe to drive if you have a splint.  Keep all follow-up visits as told by your doctor. This is important. Contact a doctor if:  You have more pain or swelling in your injured area. Get help right away if:  You have any of these problems in your injured area: ? You have numbness. ? You have tingling. ? You lose a lot of strength. Summary  A muscle strain is an injury that happens when a muscle is stretched longer than normal.  This condition is first treated with PRICE therapy. This includes protecting, resting, icing, adding pressure, and raising your injury.  Limit your activity. Rest your injured muscle as told by your doctor. Your doctor may say that gentle movements are okay.  Warm up before you exercise. This helps to prevent more muscle strains. This information is not intended to replace advice given to you by your health care provider. Make sure you discuss any questions you have with your health care provider. Document Released: 05/09/2008 Document Revised: 09/06/2016 Document Reviewed: 09/06/2016 Elsevier Interactive Patient Education  2019 Elsevier   Inc.  

## 2018-12-21 DIAGNOSIS — M25512 Pain in left shoulder: Secondary | ICD-10-CM | POA: Insufficient documentation

## 2019-01-28 ENCOUNTER — Telehealth: Payer: Self-pay | Admitting: Family Medicine

## 2019-01-28 ENCOUNTER — Other Ambulatory Visit: Payer: Self-pay | Admitting: Family Medicine

## 2019-01-28 DIAGNOSIS — G8929 Other chronic pain: Secondary | ICD-10-CM

## 2019-01-28 DIAGNOSIS — M25512 Pain in left shoulder: Secondary | ICD-10-CM

## 2019-01-28 NOTE — Telephone Encounter (Signed)
Patient would like a referral to ortho for his left shoulder pain.

## 2019-01-30 NOTE — Telephone Encounter (Signed)
Patient notified and given information

## 2019-01-31 ENCOUNTER — Other Ambulatory Visit: Payer: Self-pay

## 2019-01-31 ENCOUNTER — Ambulatory Visit (INDEPENDENT_AMBULATORY_CARE_PROVIDER_SITE_OTHER): Payer: BC Managed Care – PPO | Admitting: Family Medicine

## 2019-01-31 ENCOUNTER — Ambulatory Visit: Payer: Self-pay

## 2019-01-31 ENCOUNTER — Encounter: Payer: Self-pay | Admitting: Family Medicine

## 2019-01-31 DIAGNOSIS — G8929 Other chronic pain: Secondary | ICD-10-CM | POA: Diagnosis not present

## 2019-01-31 DIAGNOSIS — M25512 Pain in left shoulder: Secondary | ICD-10-CM

## 2019-01-31 MED ORDER — GLUCOSAMINE SULFATE 1000 MG PO CAPS: 1.0000 | ORAL_CAPSULE | Freq: Two times a day (BID) | ORAL | 3 refills | Status: AC

## 2019-01-31 MED ORDER — DICLOFENAC SODIUM 1 % TD GEL: 4.0000 g | Freq: Four times a day (QID) | TRANSDERMAL | 6 refills | Status: DC | PRN

## 2019-01-31 NOTE — Progress Notes (Signed)
Office Visit Note   Patient: Tommy Walters           Date of Birth: [birth year]           MRN: [id number] Visit Date: 01/31/2019 Requested by: Azzie Glatter, [address] [place],  [place] [address] PCP: Azzie Glatter, FNP  Subjective: Chief Complaint  Patient presents with  . Left Shoulder - Pain    Started with neck pain last November. That got better, but noticed the left shoulder was hurting. NKI. Does not hurt all the time, only with certain arm movements. Does not disturb sleep.    HPI: He is a 48 year old right-hand-dominant male with left shoulder pain.  In November he started having pain in his neck.  He woke up 1 day with left-sided pain.  Over time that got better, but then he started having pain on top of the left shoulder.  It has continued to bother him intermittently.  It is not constant enough to take medication but he has quite a bit of pain with certain movements.  Denies any numbness or tingling in his arm.  He has never had an injury to his shoulder that he can remember.  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  He commutes back and forth to [place].              ROS: No fevers or chills.  All other systems were reviewed and are negative.  Objective: Vital Signs: There were no vitals taken for this visit.  Physical Exam:  General:  Alert and oriented, in no acute distress. Pulm:  Breathing unlabored. Psy:  Normal mood, congruent affect. Skin: No rash on his skin. Left shoulder: Full active range of motion compared to the right, no adhesive capsulitis.  Full neck range of motion with negative Spurling's test.  Upper extremity strength and reflexes are normal.  Rotator cuff strength is 5/5 throughout, speeds test is negative.  He has pain with AC crossover test and tenderness over the [facility] joint.  No tenderness at the long head biceps tendon.  Imaging: X-rays left shoulder: Moderate AC joint  arthropathy.  Glenohumeral joint looks unremarkable.  No sign of neoplasm.    Assessment & Plan: 1.  Chronic left shoulder pain, suspect due to [facility] joint arthropathy. -We will treat with Voltaren gel topically, glucosamine twice daily.  If symptoms persist could try physical therapy or possibly AC joint injection.  MRI scan if fails conservative management.     Procedures: No procedures performed  No notes on file     PMFS History: Patient Active Problem List   Diagnosis Date Noted  . Left shoulder pain 12/21/2018  . Colon cancer screening 09/13/2018  . DVT (deep venous thrombosis) ([place]) 03/15/2016   Past Medical History:  Diagnosis Date  . DVT (deep venous thrombosis) ([facility])     Family History  Problem Relation Age of Onset  . Hypertension Mother   . Diabetes Mother   . Stroke Mother 60       died from     History reviewed. No pertinent surgical history. Social History   Occupational History  . Occupation: Mudlogger   Tobacco Use  . Smoking status: Never Smoker  . Smokeless tobacco: Never Used  Substance and Sexual Activity  . Alcohol use: Yes    Alcohol/week: 1.0 standard drinks    Types: 1 Glasses of wine per week    Comment:  occ.   . Drug use: No  . Sexual activity: Yes

## 2019-01-31 NOTE — Patient Instructions (Signed)
Diagnosis:   Left AC joint (acromioclavicular) arthritis.  Treatment:  - Voltaren Gel 4 times daily for 1-2 weeks and then as needed.  - Glucosamine Sulfate twice daily long-term.

## 2019-04-07 ENCOUNTER — Other Ambulatory Visit: Payer: Self-pay

## 2019-04-07 ENCOUNTER — Telehealth: Payer: Self-pay

## 2019-04-07 DIAGNOSIS — E785 Hyperlipidemia, unspecified: Secondary | ICD-10-CM

## 2019-04-07 MED ORDER — SIMVASTATIN 40 MG PO TABS: 40.0000 mg | ORAL_TABLET | Freq: Every day | ORAL | 0 refills | Status: DC

## 2019-04-07 NOTE — Telephone Encounter (Signed)
Sent refill to the pharmacy  

## 2019-05-05 ENCOUNTER — Encounter: Payer: Self-pay | Admitting: Gastroenterology

## 2019-06-13 ENCOUNTER — Other Ambulatory Visit: Payer: Self-pay

## 2019-06-13 ENCOUNTER — Ambulatory Visit (AMBULATORY_SURGERY_CENTER): Payer: BC Managed Care – PPO | Admitting: *Deleted

## 2019-06-13 VITALS — Temp 96.6°F | Ht 68.0 in | Wt 222.0 lb

## 2019-06-13 DIAGNOSIS — Z1211 Encounter for screening for malignant neoplasm of colon: Secondary | ICD-10-CM

## 2019-06-13 DIAGNOSIS — Z1159 Encounter for screening for other viral diseases: Secondary | ICD-10-CM

## 2019-06-13 MED ORDER — NA SULFATE-K SULFATE-MG SULF 17.5-3.13-1.6 GM/177ML PO SOLN: ORAL | 0 refills | Status: DC

## 2019-06-13 NOTE — Progress Notes (Signed)
Patient is here in-person for PV. Patient denies any allergies to eggs or soy. Patient denies any past surgery or anesthesia/sedation. Patient denies any oxygen use at home. Patient denies taking any diet/weight loss medications or blood thinners. Patient is not being treated for MRSA or C-diff. EMMI education assisgned to the patient for the procedure, this was explained and instructions given to patient. COVID-19 screening test is on 11/10 250 pm, the pt is aware. Pt is aware that care partner will wait in the car during procedure; if they feel like they will be too hot or cold to wait in the car; they may wait in the 4 th floor lobby. Patient is aware to bring only one care partner. We want them to wear a mask (we do not have any that we can provide them), practice social distancing, and we will check their temperatures when they get here.  I did remind the patient that their care partner needs to stay in the parking lot the entire time and have a cell phone available, we will call them when the pt is ready for discharge. Patient will wear mask into building.    Suprep $15 off coupon given to the patient.

## 2019-06-15 DIAGNOSIS — Z860100 Personal history of colon polyps, unspecified: Secondary | ICD-10-CM

## 2019-06-15 HISTORY — DX: Personal history of colon polyps, unspecified: Z86.0100

## 2019-06-15 HISTORY — DX: Personal history of colonic polyps: Z86.010

## 2019-06-16 ENCOUNTER — Encounter: Payer: Self-pay | Admitting: Gastroenterology

## 2019-06-20 ENCOUNTER — Ambulatory Visit: Payer: 59 | Admitting: Family Medicine

## 2019-06-25 ENCOUNTER — Other Ambulatory Visit: Payer: Self-pay | Admitting: Gastroenterology

## 2019-06-25 DIAGNOSIS — Z1159 Encounter for screening for other viral diseases: Secondary | ICD-10-CM | POA: Diagnosis not present

## 2019-06-26 LAB — SARS CORONAVIRUS 2 (TAT 6-24 HRS): SARS Coronavirus 2: NEGATIVE

## 2019-06-27 ENCOUNTER — Ambulatory Visit (AMBULATORY_SURGERY_CENTER): Payer: BC Managed Care – PPO | Admitting: Gastroenterology

## 2019-06-27 ENCOUNTER — Encounter: Payer: Self-pay | Admitting: Gastroenterology

## 2019-06-27 ENCOUNTER — Other Ambulatory Visit: Payer: Self-pay

## 2019-06-27 VITALS — BP 142/95 | HR 61 | Temp 98.3°F | Resp 15 | Ht 68.0 in | Wt 222.0 lb

## 2019-06-27 DIAGNOSIS — Z1211 Encounter for screening for malignant neoplasm of colon: Secondary | ICD-10-CM | POA: Diagnosis not present

## 2019-06-27 DIAGNOSIS — D122 Benign neoplasm of ascending colon: Secondary | ICD-10-CM

## 2019-06-27 DIAGNOSIS — D124 Benign neoplasm of descending colon: Secondary | ICD-10-CM

## 2019-06-27 DIAGNOSIS — D123 Benign neoplasm of transverse colon: Secondary | ICD-10-CM

## 2019-06-27 DIAGNOSIS — K5140 Inflammatory polyps of colon without complications: Secondary | ICD-10-CM

## 2019-06-27 DIAGNOSIS — D121 Benign neoplasm of appendix: Secondary | ICD-10-CM

## 2019-06-27 DIAGNOSIS — K388 Other specified diseases of appendix: Secondary | ICD-10-CM | POA: Diagnosis not present

## 2019-06-27 MED ORDER — SODIUM CHLORIDE 0.9 % IV SOLN: 500.0000 mL | INTRAVENOUS | Status: DC

## 2019-06-27 NOTE — Op Note (Signed)
[place] Patient Name: Tommy Walters Procedure Date: 06/27/2019 7:24 AM MRN: [id number] Endoscopist: Thornton Park MD, MD Age: 48 Referring MD:  Date of Birth: 1970-10-03 Gender: Male Account #: [id number] Procedure:                Colonoscopy Indications:              Screening for colorectal malignant neoplasm, This                            is the patient's first colonoscopy                           No known family history of colon cancer or polyps Medicines:                Monitored Anesthesia Care Procedure:                Pre-Anesthesia Assessment:                           - Prior to the procedure, a History and Physical                            was performed, and patient medications and                            allergies were reviewed. The patient's tolerance of                            previous anesthesia was also reviewed. The risks                            and benefits of the procedure and the sedation                            options and risks were discussed with the patient.                            All questions were answered, and informed consent                            was obtained. Prior Anticoagulants: The patient has                            taken no previous anticoagulant or antiplatelet                            agents. ASA Grade Assessment: I - A normal, healthy                            patient. After reviewing the risks and benefits,                            the patient was deemed in satisfactory condition to  undergo the procedure.                           After obtaining informed consent, the colonoscope                            was passed under direct vision. Throughout the                            procedure, the patient's blood pressure, pulse, and                            oxygen saturations were monitored continuously. The                            Colonoscope was introduced through  the anus and                            advanced to the the terminal ileum, with                            identification of the appendiceal orifice and IC                            valve. A second forward view of the right colon was                            performed. The colonoscopy was performed without                            difficulty. The patient tolerated the procedure                            well. The quality of the bowel preparation was                            excellent. The terminal ileum, ileocecal valve,                            appendiceal orifice, and rectum were photographed. Scope In: 8:05:28 AM Scope Out: 8:20:58 AM Scope Withdrawal Time: 0 hours 13 minutes 59 seconds  Total Procedure Duration: 0 hours 15 minutes 30 seconds  Findings:                 The perianal and digital rectal examinations were                            normal.                           A few small-mouthed diverticula were found in the                            sigmoid colon, descending colon and ascending colon.  A diffuse area of mildly melanotic mucosa was found                            in the entire colon.                           A less than 1 mm polyp was found in the appendiceal                            orifice. The polyp was sessile. The polyp was                            removed with a piecemeal technique using a cold                            biopsy forceps because it was too small for a snare                            within the folds of the appendical orifice.                            Resection and retrieval were complete. Estimated                            blood loss was minimal.                           A 3 mm polyp was found in the proximal ascending                            colon. The polyp was sessile. The polyp was removed                            with cold forceps in a piecemeal fashion. Resection                             and retrieval were complete. Estimated blood loss                            was minimal.                           A 4 mm polyp was found in the mid ascending colon.                            The polyp was semi-pedunculated. The polyp was too                            large for the forceps and was removed with a cold                            snare. Resection and retrieval were complete.  Estimated blood loss was minimal.                           A 3 mm polyp was found in the descending colon. The                            polyp was sessile. The polyp was removed with a                            cold snare. Resection and retrieval were complete.                            Estimated blood loss was minimal.                           Non-bleeding internal hemorrhoids were found. The                            hemorrhoids were small. Complications:            No immediate complications. Estimated blood loss:                            Minimal. Estimated Blood Loss:     Estimated blood loss was minimal. Impression:               - Diverticulosis in the sigmoid colon, in the                            descending colon and in the ascending colon.                           - Melanotic mucosa in the entire examined colon.                           - One less than 1 mm polyp at the appendiceal                            orifice, removed piecemeal using a cold biopsy                            forceps. Resected and retrieved.                           - One 3 mm polyp at the appendiceal orifice,                            removed with a cold snare. Resected and retrieved.                           - One 4 mm polyp in the mid ascending colon,                            removed with a cold snare. Resected and retrieved.                           -  One 3 mm polyp in the descending colon, removed                            with a cold snare. Resected and retrieved.                            - Non-bleeding internal hemorrhoids. Recommendation:           - Patient has a contact number available for                            emergencies. The signs and symptoms of potential                            delayed complications were discussed with the                            patient. Return to normal activities tomorrow.                            Written discharge instructions were provided to the                            patient.                           - Resume previous diet today. High fiber diet                            encouraged.                           - Continue present medications.                           - Await pathology results.                           - Repeat colonoscopy date to be determined after                            pending pathology results are reviewed for                            surveillance. Thornton Park MD, MD 06/27/2019 8:30:44 AM This report has been signed electronically.

## 2019-06-27 NOTE — Progress Notes (Signed)
Temp JB  vs CW 

## 2019-06-27 NOTE — Patient Instructions (Signed)
Handouts given for polyps, diverticulosis and hemorrhoids.  Await pathology.  YOU HAD AN ENDOSCOPIC PROCEDURE TODAY AT [facility]:   Refer to the procedure report that was given to you for any specific questions about what was found during the examination.  If the procedure report does not answer your questions, please call your gastroenterologist to clarify.  If you requested that your care partner not be given the details of your procedure findings, then the procedure report has been included in a sealed envelope for you to review at your convenience later.  YOU SHOULD EXPECT: Some feelings of bloating in the abdomen. Passage of more gas than usual.  Walking can help get rid of the air that was put into your GI tract during the procedure and reduce the bloating. If you had a lower endoscopy (such as a colonoscopy or flexible sigmoidoscopy) you may notice spotting of blood in your stool or on the toilet paper. If you underwent a bowel prep for your procedure, you may not have a normal bowel movement for a few days.  Please Note:  You might notice some irritation and congestion in your nose or some drainage.  This is from the oxygen used during your procedure.  There is no need for concern and it should clear up in a day or so.  SYMPTOMS TO REPORT IMMEDIATELY:   Following lower endoscopy (colonoscopy or flexible sigmoidoscopy):  Excessive amounts of blood in the stool  Significant tenderness or worsening of abdominal pains  Swelling of the abdomen that is new, acute  Fever of 100F or higher  For urgent or emergent issues, a gastroenterologist can be reached at any hour by calling [phone].   DIET:  We do recommend a small meal at first, but then you may proceed to your regular diet.  Drink plenty of fluids but you should avoid alcoholic beverages for 24 hours.  ACTIVITY:  You should plan to take it easy for the rest of today and you should NOT DRIVE or use heavy  machinery until tomorrow (because of the sedation medicines used during the test).    FOLLOW UP: Our staff will call the number listed on your records 48-72 hours following your procedure to check on you and address any questions or concerns that you may have regarding the information given to you following your procedure. If we do not reach you, we will leave a message.  We will attempt to reach you two times.  During this call, we will ask if you have developed any symptoms of COVID 19. If you develop any symptoms (ie: fever, flu-like symptoms, shortness of breath, cough etc.) before then, please call [phone].  If you test positive for Covid 19 in the 2 weeks post procedure, please call and report this information to [place].    If any biopsies were taken you will be contacted by phone or by letter within the next 1-3 weeks.  Please call us at [phone] if you have not heard about the biopsies in 3 weeks.    SIGNATURES/CONFIDENTIALITY: You and/or your care partner have signed paperwork which will be entered into your electronic medical record.  These signatures attest to the fact that that the information above on your After Visit Summary has been reviewed and is understood.  Full responsibility of the confidentiality of this discharge information lies with you and/or your care-partner.

## 2019-06-27 NOTE — Progress Notes (Signed)
Called to room to assist during endoscopic procedure.  Patient ID and intended procedure confirmed with present staff. Received instructions for my participation in the procedure from the performing physician.  

## 2019-06-27 NOTE — Progress Notes (Signed)
Report given to PACU, vss 

## 2019-07-01 ENCOUNTER — Encounter: Payer: Self-pay | Admitting: Gastroenterology

## 2019-07-01 ENCOUNTER — Telehealth: Payer: Self-pay | Admitting: *Deleted

## 2019-07-01 ENCOUNTER — Telehealth: Payer: Self-pay

## 2019-07-01 NOTE — Telephone Encounter (Signed)
Called [phone] and was unable to leave a message d/t the "mailbox is full". This was a follow up call from colon on 06-27-2019.  maw

## 2019-07-01 NOTE — Telephone Encounter (Signed)
  Follow up Call-  Call back number 06/27/2019  Post procedure Call Back phone  # [phone]  Permission to leave phone message Yes  Some recent data might be hidden     Patient questions:  Mailbox is full.

## 2019-07-17 ENCOUNTER — Other Ambulatory Visit: Payer: Self-pay | Admitting: Family Medicine

## 2019-07-17 DIAGNOSIS — E785 Hyperlipidemia, unspecified: Secondary | ICD-10-CM

## 2019-07-25 ENCOUNTER — Encounter: Payer: Self-pay | Admitting: Family Medicine

## 2019-07-25 ENCOUNTER — Ambulatory Visit (INDEPENDENT_AMBULATORY_CARE_PROVIDER_SITE_OTHER): Payer: BC Managed Care – PPO | Admitting: Family Medicine

## 2019-07-25 ENCOUNTER — Other Ambulatory Visit: Payer: Self-pay

## 2019-07-25 VITALS — BP 132/66 | HR 62 | Temp 97.9°F | Ht 68.0 in | Wt 224.4 lb

## 2019-07-25 DIAGNOSIS — E538 Deficiency of other specified B group vitamins: Secondary | ICD-10-CM | POA: Diagnosis not present

## 2019-07-25 DIAGNOSIS — E785 Hyperlipidemia, unspecified: Secondary | ICD-10-CM | POA: Diagnosis not present

## 2019-07-25 DIAGNOSIS — Z86718 Personal history of other venous thrombosis and embolism: Secondary | ICD-10-CM

## 2019-07-25 DIAGNOSIS — Z1321 Encounter for screening for nutritional disorder: Secondary | ICD-10-CM | POA: Diagnosis not present

## 2019-07-25 DIAGNOSIS — Z131 Encounter for screening for diabetes mellitus: Secondary | ICD-10-CM

## 2019-07-25 DIAGNOSIS — Z1329 Encounter for screening for other suspected endocrine disorder: Secondary | ICD-10-CM | POA: Diagnosis not present

## 2019-07-25 DIAGNOSIS — Z0000 Encounter for general adult medical examination without abnormal findings: Secondary | ICD-10-CM

## 2019-07-25 DIAGNOSIS — M25512 Pain in left shoulder: Secondary | ICD-10-CM | POA: Diagnosis not present

## 2019-07-25 DIAGNOSIS — G8929 Other chronic pain: Secondary | ICD-10-CM

## 2019-07-25 DIAGNOSIS — Z09 Encounter for follow-up examination after completed treatment for conditions other than malignant neoplasm: Secondary | ICD-10-CM

## 2019-07-25 LAB — POCT URINALYSIS DIPSTICK
Bilirubin, UA: NEGATIVE
Blood, UA: NEGATIVE
Glucose, UA: NEGATIVE
Ketones, UA: NEGATIVE
Leukocytes, UA: NEGATIVE
Nitrite, UA: NEGATIVE
Protein, UA: NEGATIVE
Spec Grav, UA: 1.02 (ref 1.010–1.025)
Urobilinogen, UA: 0.2 E.U./dL
pH, UA: 7.5 (ref 5.0–8.0)

## 2019-07-25 LAB — POCT GLYCOSYLATED HEMOGLOBIN (HGB A1C): Hemoglobin A1C: 5.5 % (ref 4.0–5.6)

## 2019-07-25 LAB — GLUCOSE, POCT (MANUAL RESULT ENTRY): POC Glucose: 79 mg/dl (ref 70–99)

## 2019-07-25 NOTE — Progress Notes (Signed)
Patient [facility]   Established Patient Office Visit  Subjective:  Patient ID: Tommy Walters, male    DOB: Jul 14, 1971  Age: 48 y.o. MRN: [id number]  CC:  Chief Complaint  Patient presents with  . Follow-up    6 mth follow up HTN & DM    HPI Tommy Walters is a 48 year old male who presents for Follow Up today.   Past Medical History:  Diagnosis Date  . DVT (deep venous thrombosis) (HCC)    Current Status: Since his last office visit, he is doing well with no complaints. He has history of DVT. He denies pain, swelling, erythema, warmth, and tenderness of the effected limb. Denies sudden onset of dyspnea and coughing. Denies Productive frothy, pink-tinged sputum. Denies tachycardia, pallor, feelings of impending doom. Denies symptoms of DVT, history of A-fib, any recent surgeries, pregnancy, long-bone fractures, and prolonged inactivity (sedentary, long distant traveling).  He denies fevers, chills, fatigue, recent infections, weight loss, and night sweats. He has not had any headaches, visual changes, dizziness, and falls. No chest pain, heart palpitations, cough and shortness of breath reported. No reports of GI problems such as nausea, vomiting, diarrhea, and constipation. He has no reports of blood in stools, dysuria and hematuria. No depression or anxiety reported today. He denies suicidal ideations, homicidal ideations, or auditory hallucinations. He denies pain today.   Past Surgical History:  Procedure Laterality Date  . NO PAST SURGERIES     Family History  Problem Relation Age of Onset  . Hypertension Mother   . Diabetes Mother   . Stroke Mother 76       died from   . Colon cancer Neg Hx   . Colon polyps Neg Hx   . Esophageal cancer Neg Hx   . Rectal cancer Neg Hx   . Stomach cancer Neg Hx     Social History   Socioeconomic History  . Marital status: Married    Spouse name: [name]  . Number of children: 4  . Years of  education: MS  . Highest education level: Not on file  Occupational History  . Occupation: Mudlogger   Tobacco Use  . Smoking status: Never Smoker  . Smokeless tobacco: Never Used  Substance and Sexual Activity  . Alcohol use: Yes    Alcohol/week: 1.0 standard drinks    Types: 1 Glasses of wine per week  . Drug use: No  . Sexual activity: Yes  Other Topics Concern  . Not on file  Social History Narrative   Lives with wife and 3 daughters.   Son is in school elsewhere.   Social Determinants of Health   Financial Resource Strain:   . Difficulty of Paying Living Expenses: Not on file  Food Insecurity:   . Worried About Charity fundraiser in the Last Year: Not on file  . Ran Out of Food in the Last Year: Not on file  Transportation Needs:   . Lack of Transportation (Medical): Not on file  . Lack of Transportation (Non-Medical): Not on file  Physical Activity:   . Days of Exercise per Week: Not on file  . Minutes of Exercise per Session: Not on file  Stress:   . Feeling of Stress : Not on file  Social Connections:   . Frequency of Communication with Friends and Family: Not on file  . Frequency of Social Gatherings with Friends and Family: Not on file  . Attends  Religious Services: Not on file  . Active Member of Clubs or Organizations: Not on file  . Attends Archivist Meetings: Not on file  . Marital Status: Not on file  Intimate Partner Violence:   . Fear of Current or Ex-Partner: Not on file  . Emotionally Abused: Not on file  . Physically Abused: Not on file  . Sexually Abused: Not on file    Outpatient Medications Prior to Visit  Medication Sig Dispense Refill  . acetaminophen (TYLENOL) 500 MG tablet Take 2 tablets (1,000 mg total) by mouth every 6 (six) hours as needed for mild pain, fever or headache. (Patient not taking: Reported on 06/27/2019) 30 tablet 5  . Boswellia-Glucosamine-Vit D (OSTEO BI-FLEX ONE PER DAY PO) Take by mouth.    .  Cholecalciferol (VITAMIN D3 PO) Take by mouth.    . Coenzyme Q10 (CO Q 10 PO) Take by mouth.    . Cyanocobalamin (B-12 PO) Take by mouth.    . famotidine (PEPCID) 20 MG tablet Take 1 tablet (20 mg total) by mouth 2 (two) times daily. 90 tablet 1  . Glucosamine Sulfate 1000 MG CAPS Take 1 capsule (1,000 mg total) by mouth 2 (two) times daily. (Patient not taking: Reported on 06/27/2019) 180 capsule 3  . Melatonin-Pyridoxine (MELATIN PO) Take by mouth.    . Multiple Vitamin (ONE-A-DAY MENS PO) Take by mouth.    . Na Sulfate-K Sulfate-Mg Sulf 17.5-3.13-1.6 GM/177ML SOLN Suprep (no substitutions)-TAKE AS DIRECTED. 354 mL 0  . simvastatin (ZOCOR) 40 MG tablet TAKE 1 TABLET BY MOUTH AT BEDTIME 90 tablet 0  . TURMERIC PO Take by mouth.     No facility-administered medications prior to visit.    Allergies  Allergen Reactions  . Aspirin Other (See Comments)    Too much causes blood in stool.  . Caffeine Other (See Comments)    GI upset  . Cherry Nausea And Vomiting    ROS Review of Systems  Constitutional: Negative.   HENT: Negative.   Eyes: Negative.   Respiratory: Negative.   Cardiovascular: Negative.   Gastrointestinal: Negative.   Endocrine: Negative.   Genitourinary: Negative.   Musculoskeletal: Negative.   Skin: Negative.   Allergic/Immunologic: Negative.   Neurological: Negative.   Hematological: Negative.   Psychiatric/Behavioral: Negative.       Objective:    Physical Exam  Constitutional: He is oriented to person, place, and time. He appears well-developed and well-nourished.  HENT:  Head: Normocephalic and atraumatic.  Eyes: Conjunctivae are normal.  Cardiovascular: Normal rate, regular rhythm, normal heart sounds and intact distal pulses.  Pulmonary/Chest: Effort normal and breath sounds normal.  Abdominal: Soft. Bowel sounds are normal.  Musculoskeletal:        General: Normal range of motion.     Cervical back: Normal range of motion and neck supple.    Neurological: He is alert and oriented to person, place, and time. He has normal reflexes.  Skin: Skin is warm and dry.  Psychiatric: He has a normal mood and affect. His behavior is normal. Judgment and thought content normal.  Nursing note and vitals reviewed.   BP 132/66   Pulse 62   Temp 97.9 F (36.6 C) (Oral)   Ht 5' 8" (1.727 m)   Wt 224 lb 6.4 oz (101.8 kg)   SpO2 97%   BMI 34.12 kg/m  Wt Readings from Last 3 Encounters:  07/25/19 224 lb 6.4 oz (101.8 kg)  06/27/19 222 lb (100.7 kg)  06/13/19 222  lb (100.7 kg)     Health Maintenance Due  Topic Date Due  . INFLUENZA VACCINE  03/15/2019    There are no preventive care reminders to display for this patient.  Lab Results  Component Value Date   TSH 1.200 07/25/2019   Lab Results  Component Value Date   WBC 6.9 07/25/2019   HGB 15.0 07/25/2019   HCT 46.7 07/25/2019   MCV 85 07/25/2019   PLT 215 07/25/2019   Lab Results  Component Value Date   NA 140 07/25/2019   K 4.3 07/25/2019   CO2 25 07/25/2019   GLUCOSE 82 07/25/2019   BUN 9 07/25/2019   CREATININE 1.04 07/25/2019   BILITOT 0.4 07/25/2019   ALKPHOS 62 07/25/2019   AST 28 07/25/2019   ALT 23 07/25/2019   PROT 7.1 07/25/2019   ALBUMIN 4.5 07/25/2019   CALCIUM 9.8 07/25/2019   ANIONGAP 6 03/02/2016   Lab Results  Component Value Date   CHOL 155 07/25/2019   Lab Results  Component Value Date   HDL 60 07/25/2019   Lab Results  Component Value Date   LDLCALC 85 07/25/2019   Lab Results  Component Value Date   TRIG 46 07/25/2019   Lab Results  Component Value Date   CHOLHDL 2.6 07/25/2019   Lab Results  Component Value Date   HGBA1C 5.5 07/25/2019      Assessment & Plan:   1. Chronic left shoulder pain  2. History of DVT (deep vein thrombosis) Stable today. No signs or symptoms of recurrence noted aor reported today.   3. Hyperlipidemia, unspecified hyperlipidemia type  4. Screening for diabetes mellitus Glucose and Hgb  A1c within normal range. He will continue medication as prescribed, to decrease foods/beverages high in sugars and carbs and follow Heart Healthy or DASH diet. Increase physical activity to at least 30 minutes cardio exercise daily.  - Urinalysis Dipstick - POCT HgB A1C - Glucose (CBG) - Lipid Panel  5. Healthcare maintenance - TSH - Lipid Panel - CBC with Differential - Comp Met (CMET) - Vitamin B12 - Vitamin D, 25-hydroxy  6. Follow up He will follow up in 6 months.   No orders of the defined types were placed in this encounter.   Orders Placed This Encounter  Procedures  . TSH  . Lipid Panel  . CBC with Differential  . Comp Met (CMET)  . Vitamin B12  . Vitamin D, 25-hydroxy  . Urinalysis Dipstick  . POCT HgB A1C  . Glucose (CBG)    Referral Orders  No referral(s) requested today   Kathe Becton,  MSN, FNP-BC [place], [place] [address] [phone]- fax  Problem List Items Addressed This Visit      Other   Left shoulder pain - Primary    Other Visit Diagnoses    History of DVT (deep vein thrombosis)       Hyperlipidemia, unspecified hyperlipidemia type       Screening for diabetes mellitus       Relevant Orders   Urinalysis Dipstick (Completed)   POCT HgB A1C (Completed)   Glucose (CBG) (Completed)   Lipid Panel (Completed)   Healthcare maintenance       Relevant Orders   TSH (Completed)   Lipid Panel (Completed)   CBC with Differential (Completed)   Comp Met (CMET) (Completed)   Vitamin B12 (Completed)   Vitamin D, 25-hydroxy (Completed)  Follow up          No orders of the defined types were placed in this encounter.   Follow-up: Return in about 6 months (around 01/23/2020).    Azzie Glatter, FNP

## 2019-07-26 LAB — CBC WITH DIFFERENTIAL/PLATELET
Basophils Absolute: 0.1 10*3/uL (ref 0.0–0.2)
Basos: 1 %
EOS (ABSOLUTE): 0.4 10*3/uL (ref 0.0–0.4)
Eos: 6 %
Hematocrit: 46.7 % (ref 37.5–51.0)
Hemoglobin: 15 g/dL (ref 13.0–17.7)
Immature Grans (Abs): 0 10*3/uL (ref 0.0–0.1)
Immature Granulocytes: 0 %
Lymphocytes Absolute: 3.1 10*3/uL (ref 0.7–3.1)
Lymphs: 44 %
MCH: 27.3 pg (ref 26.6–33.0)
MCHC: 32.1 g/dL (ref 31.5–35.7)
MCV: 85 fL (ref 79–97)
Monocytes Absolute: 0.5 10*3/uL (ref 0.1–0.9)
Monocytes: 8 %
Neutrophils Absolute: 2.8 10*3/uL (ref 1.4–7.0)
Neutrophils: 41 %
Platelets: 215 10*3/uL (ref 150–450)
RBC: 5.49 x10E6/uL (ref 4.14–5.80)
RDW: 13 % (ref 11.6–15.4)
WBC: 6.9 10*3/uL (ref 3.4–10.8)

## 2019-07-26 LAB — COMPREHENSIVE METABOLIC PANEL
ALT: 23 IU/L (ref 0–44)
AST: 28 IU/L (ref 0–40)
Albumin/Globulin Ratio: 1.7 (ref 1.2–2.2)
Albumin: 4.5 g/dL (ref 4.0–5.0)
Alkaline Phosphatase: 62 IU/L (ref 39–117)
BUN/Creatinine Ratio: 9 (ref 9–20)
BUN: 9 mg/dL (ref 6–24)
Bilirubin Total: 0.4 mg/dL (ref 0.0–1.2)
CO2: 25 mmol/L (ref 20–29)
Calcium: 9.8 mg/dL (ref 8.7–10.2)
Chloride: 103 mmol/L (ref 96–106)
Creatinine, Ser: 1.04 mg/dL (ref 0.76–1.27)
GFR calc Af Amer: 96 mL/min/{1.73_m2} (ref >59)
GFR calc non Af Amer: 83 mL/min/{1.73_m2} (ref >59)
Globulin, Total: 2.6 g/dL (ref 1.5–4.5)
Glucose: 82 mg/dL (ref 65–99)
Potassium: 4.3 mmol/L (ref 3.5–5.2)
Sodium: 140 mmol/L (ref 134–144)
Total Protein: 7.1 g/dL (ref 6.0–8.5)

## 2019-07-26 LAB — VITAMIN D 25 HYDROXY (VIT D DEFICIENCY, FRACTURES): Vit D, 25-Hydroxy: 34.8 ng/mL (ref 30.0–100.0)

## 2019-07-26 LAB — VITAMIN B12: Vitamin B-12: 1833 pg/mL — ABNORMAL HIGH (ref 232–1245)

## 2019-07-26 LAB — LIPID PANEL
Chol/HDL Ratio: 2.6 ratio (ref 0.0–5.0)
Cholesterol, Total: 155 mg/dL (ref 100–199)
HDL: 60 mg/dL (ref >39)
LDL Chol Calc (NIH): 85 mg/dL (ref 0–99)
Triglycerides: 46 mg/dL (ref 0–149)
VLDL Cholesterol Cal: 10 mg/dL (ref 5–40)

## 2019-07-26 LAB — TSH: TSH: 1.2 u[IU]/mL (ref 0.450–4.500)

## 2019-07-27 DIAGNOSIS — Z86718 Personal history of other venous thrombosis and embolism: Secondary | ICD-10-CM | POA: Insufficient documentation

## 2019-10-14 DIAGNOSIS — Z20828 Contact with and (suspected) exposure to other viral communicable diseases: Secondary | ICD-10-CM | POA: Diagnosis not present

## 2019-10-14 DIAGNOSIS — Z20822 Contact with and (suspected) exposure to covid-19: Secondary | ICD-10-CM | POA: Diagnosis not present

## 2019-10-28 DIAGNOSIS — Z2821 Immunization not carried out because of patient refusal: Secondary | ICD-10-CM | POA: Insufficient documentation

## 2019-10-28 DIAGNOSIS — E785 Hyperlipidemia, unspecified: Secondary | ICD-10-CM | POA: Insufficient documentation

## 2019-10-28 DIAGNOSIS — Z125 Encounter for screening for malignant neoplasm of prostate: Secondary | ICD-10-CM | POA: Diagnosis not present

## 2019-10-30 ENCOUNTER — Other Ambulatory Visit: Payer: Self-pay | Admitting: Family Medicine

## 2019-10-30 DIAGNOSIS — E785 Hyperlipidemia, unspecified: Secondary | ICD-10-CM

## 2019-12-27 DIAGNOSIS — Z20828 Contact with and (suspected) exposure to other viral communicable diseases: Secondary | ICD-10-CM | POA: Diagnosis not present

## 2020-01-23 ENCOUNTER — Encounter: Payer: Self-pay | Admitting: Family Medicine

## 2020-01-23 ENCOUNTER — Other Ambulatory Visit: Payer: Self-pay

## 2020-01-23 ENCOUNTER — Ambulatory Visit (INDEPENDENT_AMBULATORY_CARE_PROVIDER_SITE_OTHER): Payer: BC Managed Care – PPO | Admitting: Family Medicine

## 2020-01-23 VITALS — BP 136/77 | HR 73 | Temp 97.7°F | Ht 68.0 in | Wt 227.0 lb

## 2020-01-23 DIAGNOSIS — I10 Essential (primary) hypertension: Secondary | ICD-10-CM

## 2020-01-23 DIAGNOSIS — R0602 Shortness of breath: Secondary | ICD-10-CM

## 2020-01-23 DIAGNOSIS — Z86718 Personal history of other venous thrombosis and embolism: Secondary | ICD-10-CM

## 2020-01-23 DIAGNOSIS — Z09 Encounter for follow-up examination after completed treatment for conditions other than malignant neoplasm: Secondary | ICD-10-CM

## 2020-01-23 DIAGNOSIS — E785 Hyperlipidemia, unspecified: Secondary | ICD-10-CM

## 2020-01-23 NOTE — Progress Notes (Signed)
Patient [facility]   Established Patient Office Visit  Subjective:  Patient ID: Tommy Walters, male    DOB: 1971-02-06  Age: 49 y.o. MRN: [id number]  CC:  Chief Complaint  Patient presents with  . Follow-up    regular check --up and left shoulder doing ok    HPI Tommy Walters is a 49 year old male who presents for Follow Up today.   Past Medical History:  Diagnosis Date  . DVT (deep venous thrombosis) [facility])      Patient Active Problem List   Diagnosis Date Noted  . History of DVT (deep vein thrombosis) 07/27/2019  . Left shoulder pain 12/21/2018  . Colon cancer screening 09/13/2018  . DVT (deep venous thrombosis) ([place]) 03/15/2016  . Essential hypertension 06/28/2014   Current Status: Since his last office visit, he is doing well with no complaints. He states that he had adverse signs and symptoms from coronavirus vaccine X 2 weeks. He received the 1 dose vaccine. He had symptoms of shortness of breath, headaches, fatigue, lung wheezes, and scalp tenderness. He has history of DVT, but denies pain, swelling, erythema, warmth, and tenderness of the effected limb. No reports of chest pain, heart palpitations a nd cough. He denies fevers,chills, fatigue, recent infections, weight loss, and night sweats. He has not had any headaches, visual changes, dizziness, and falls. Denies GI problems such as nausea, vomiting, diarrhea, and constipation. He has no reports of blood in stools, dysuria and hematuria. No depression or anxiety reported. He denies suicidal ideations, homicidal ideations, or auditory hallucinations. He is taking all medications as prescribed. He denies pain today.   Past Surgical History:  Procedure Laterality Date  . NO PAST SURGERIES      Family History  Problem Relation Age of Onset  . Hypertension Mother   . Diabetes Mother   . Stroke Mother 34       died from   . Colon cancer Neg Hx   . Colon polyps Neg Hx     . Esophageal cancer Neg Hx   . Rectal cancer Neg Hx   . Stomach cancer Neg Hx     Social History   Socioeconomic History  . Marital status: Married    Spouse name: [name]  . Number of children: 4  . Years of education: MS  . Highest education level: Not on file  Occupational History  . Occupation: Mudlogger   Tobacco Use  . Smoking status: Never Smoker  . Smokeless tobacco: Never Used  Vaping Use  . Vaping Use: Never used  Substance and Sexual Activity  . Alcohol use: Yes    Alcohol/week: 1.0 standard drink    Types: 1 Glasses of wine per week  . Drug use: No  . Sexual activity: Yes  Other Topics Concern  . Not on file  Social History Narrative   Lives with wife and 3 daughters.   Son is in school elsewhere.   Social Determinants of Health   Financial Resource Strain:   . Difficulty of Paying Living Expenses:   Food Insecurity:   . Worried About Charity fundraiser in the Last Year:   . Arboriculturist in the Last Year:   Transportation Needs:   . Film/video editor (Medical):   Marland Kitchen Lack of Transportation (Non-Medical):   Physical Activity:   . Days of Exercise per Week:   . Minutes of Exercise per Session:  Stress:   . Feeling of Stress :   Social Connections:   . Frequency of Communication with Friends and Family:   . Frequency of Social Gatherings with Friends and Family:   . Attends Religious Services:   . Active Member of Clubs or Organizations:   . Attends Archivist Meetings:   Marland Kitchen Marital Status:   Intimate Partner Violence:   . Fear of Current or Ex-Partner:   . Emotionally Abused:   Marland Kitchen Physically Abused:   . Sexually Abused:     Outpatient Medications Prior to Visit  Medication Sig Dispense Refill  . acetaminophen (TYLENOL) 500 MG tablet Take 2 tablets (1,000 mg total) by mouth every 6 (six) hours as needed for mild pain, fever or headache. 30 tablet 5  . Boswellia-Glucosamine-Vit D (OSTEO BI-FLEX ONE PER DAY PO) Take by  mouth.    . Cholecalciferol (VITAMIN D3 PO) Take by mouth.    . Coenzyme Q10 (CO Q 10 PO) Take by mouth.    . Cyanocobalamin (B-12 PO) Take by mouth.    . famotidine (PEPCID) 20 MG tablet Take 1 tablet (20 mg total) by mouth 2 (two) times daily. 90 tablet 1  . Glucosamine Sulfate 1000 MG CAPS Take 1 capsule (1,000 mg total) by mouth 2 (two) times daily. 180 capsule 3  . Melatonin-Pyridoxine (MELATIN PO) Take by mouth.    . Multiple Vitamin (ONE-A-DAY MENS PO) Take by mouth.    . Na Sulfate-K Sulfate-Mg Sulf 17.5-3.13-1.6 GM/177ML SOLN Suprep (no substitutions)-TAKE AS DIRECTED. 354 mL 0  . simvastatin (ZOCOR) 40 MG tablet TAKE 1 TABLET BY MOUTH AT BEDTIME 90 tablet 0  . TURMERIC PO Take by mouth.     No facility-administered medications prior to visit.    Allergies  Allergen Reactions  . Aspirin Other (See Comments)    Too much causes blood in stool.  . Caffeine Other (See Comments)    GI upset  . Cherry Nausea And Vomiting    ROS Review of Systems  Constitutional: Negative.   HENT: Negative.   Eyes: Negative.   Respiratory: Negative.   Cardiovascular: Negative.   Gastrointestinal: Negative.   Endocrine: Negative.   Genitourinary: Negative.   Musculoskeletal: Negative.   Skin: Negative.   Allergic/Immunologic: Negative.   Neurological: Negative.   Hematological: Negative.   Psychiatric/Behavioral: Negative.       Objective:    Physical Exam Vitals and nursing note reviewed.  Constitutional:      Appearance: Normal appearance.  HENT:     Head: Normocephalic and atraumatic.     Nose: Nose normal.     Mouth/Throat:     Mouth: Mucous membranes are dry.     Pharynx: Oropharynx is clear.  Cardiovascular:     Rate and Rhythm: Normal rate and regular rhythm.     Pulses: Normal pulses.     Heart sounds: Normal heart sounds.  Pulmonary:     Effort: Pulmonary effort is normal.     Breath sounds: Normal breath sounds.  Abdominal:     General: Bowel sounds are  normal. There is distension (obese).  Musculoskeletal:        General: Normal range of motion.     Cervical back: Normal range of motion and neck supple.  Skin:    General: Skin is warm and dry.  Neurological:     Mental Status: He is alert.  Psychiatric:        Mood and Affect: Mood normal.  Behavior: Behavior normal.        Thought Content: Thought content normal.        Judgment: Judgment normal.     BP 136/77   Pulse 73   Temp 97.7 F (36.5 C)   Ht 5\' 8"  (1.727 m)   Wt 227 lb (103 kg)   BMI 34.52 kg/m  Wt Readings from Last 3 Encounters:  01/23/20 227 lb (103 kg)  07/25/19 224 lb 6.4 oz (101.8 kg)  06/27/19 222 lb (100.7 kg)     Health Maintenance Due  Topic Date Due  . Hepatitis C Screening  Never done  . COVID-19 Vaccine (1) Never done    There are no preventive care reminders to display for this patient.  Lab Results  Component Value Date   TSH 1.200 07/25/2019   Lab Results  Component Value Date   WBC 6.9 07/25/2019   HGB 15.0 07/25/2019   HCT 46.7 07/25/2019   MCV 85 07/25/2019   PLT 215 07/25/2019   Lab Results  Component Value Date   NA 140 07/25/2019   K 4.3 07/25/2019   CO2 25 07/25/2019   GLUCOSE 82 07/25/2019   BUN 9 07/25/2019   CREATININE 1.04 07/25/2019   BILITOT 0.4 07/25/2019   ALKPHOS 62 07/25/2019   AST 28 07/25/2019   ALT 23 07/25/2019   PROT 7.1 07/25/2019   ALBUMIN 4.5 07/25/2019   CALCIUM 9.8 07/25/2019   ANIONGAP 6 03/02/2016   Lab Results  Component Value Date   CHOL 155 07/25/2019   Lab Results  Component Value Date   HDL 60 07/25/2019   Lab Results  Component Value Date   LDLCALC 85 07/25/2019   Lab Results  Component Value Date   TRIG 46 07/25/2019   Lab Results  Component Value Date   CHOLHDL 2.6 07/25/2019   Lab Results  Component Value Date   HGBA1C 5.5 07/25/2019      Assessment & Plan:   1. Essential hypertension The current medical regimen is effective; blood pressure is stable  at 136/77 today; continue present plan and medications as prescribed. He will continue to take medications as prescribed, to decrease high sodium intake, excessive alcohol intake, increase potassium intake, smoking cessation, and increase physical activity of at least 30 minutes of cardio activity daily. He will continue to follow Heart Healthy or DASH diet.  2. Hyperlipidemia, unspecified hyperlipidemia type  3. History of DVT (deep vein thrombosis) Stable. No pain, swelling, erythema, warmth, and tenderness of the effected limb.   4. Shortness of breath Symptomatic shortness of breath and other adverse side effects post the 1 dose Coronavirus Vaccination ([facility]). Order for Chest X-Ray for further evaluation.  - DG Chest 2 View; Future  5. Follow up He will follow up in 6 months.   No orders of the defined types were placed in this encounter.   Orders Placed This Encounter  Procedures  . DG Chest 2 View    Referral Orders  No referral(s) requested today    Kathe Becton,  MSN, FNP-BC [facility] [address] [place], [place] [address] [phone] fax   Problem List Items Addressed This Visit      Cardiovascular and Mediastinum   Essential hypertension - Primary     Other   History of DVT (deep vein thrombosis)    Other Visit Diagnoses    Hyperlipidemia, unspecified hyperlipidemia type  Shortness of breath       Relevant Orders   DG Chest 2 View   Follow up          No orders of the defined types were placed in this encounter.   Follow-up: Return in about 6 months (around 07/24/2020).    Azzie Glatter, FNP

## 2020-01-25 ENCOUNTER — Encounter: Payer: Self-pay | Admitting: Family Medicine

## 2020-01-30 ENCOUNTER — Other Ambulatory Visit: Payer: Self-pay

## 2020-01-30 ENCOUNTER — Ambulatory Visit (HOSPITAL_COMMUNITY)
Admission: RE | Admit: 2020-01-30 | Discharge: 2020-01-30 | Disposition: A | Discharge status: Home / Self Care | Payer: BC Managed Care – PPO | Source: Ambulatory Visit | Attending: Family Medicine | Admitting: Family Medicine

## 2020-01-30 DIAGNOSIS — R0602 Shortness of breath: Secondary | ICD-10-CM | POA: Diagnosis not present

## 2020-01-30 DIAGNOSIS — J984 Other disorders of lung: Secondary | ICD-10-CM | POA: Diagnosis not present

## 2020-02-02 ENCOUNTER — Telehealth: Payer: Self-pay | Admitting: Family Medicine

## 2020-02-02 NOTE — Progress Notes (Signed)
Voicemail full, unable to leave messages

## 2020-02-02 NOTE — Telephone Encounter (Addendum)
Error

## 2020-02-03 ENCOUNTER — Telehealth: Payer: Self-pay | Admitting: Family Medicine

## 2020-02-03 NOTE — Telephone Encounter (Signed)
Pt called inquiring about test results

## 2020-02-05 NOTE — Progress Notes (Signed)
Patient notified of results, no additional questions. 

## 2020-02-11 ENCOUNTER — Other Ambulatory Visit: Payer: Self-pay | Admitting: Family Medicine

## 2020-02-11 DIAGNOSIS — E785 Hyperlipidemia, unspecified: Secondary | ICD-10-CM

## 2020-04-29 DIAGNOSIS — Z20828 Contact with and (suspected) exposure to other viral communicable diseases: Secondary | ICD-10-CM | POA: Diagnosis not present

## 2020-05-17 DIAGNOSIS — Z20828 Contact with and (suspected) exposure to other viral communicable diseases: Secondary | ICD-10-CM | POA: Diagnosis not present

## 2020-05-31 DIAGNOSIS — Z20822 Contact with and (suspected) exposure to covid-19: Secondary | ICD-10-CM | POA: Diagnosis not present

## 2020-06-02 ENCOUNTER — Encounter: Payer: Self-pay | Admitting: Family Medicine

## 2020-06-03 ENCOUNTER — Telehealth: Payer: Self-pay | Admitting: Family Medicine

## 2020-06-03 NOTE — Telephone Encounter (Signed)
Antibiotics to help him fight [id number]

## 2020-06-04 ENCOUNTER — Telehealth (INDEPENDENT_AMBULATORY_CARE_PROVIDER_SITE_OTHER): Payer: BC Managed Care – PPO | Admitting: Family Medicine

## 2020-06-04 ENCOUNTER — Encounter: Payer: Self-pay | Admitting: Family Medicine

## 2020-06-04 ENCOUNTER — Other Ambulatory Visit: Payer: Self-pay | Admitting: Family Medicine

## 2020-06-04 DIAGNOSIS — B342 Coronavirus infection, unspecified: Secondary | ICD-10-CM

## 2020-06-04 MED ORDER — AMOXICILLIN-POT CLAVULANATE 875-125 MG PO TABS: 1.0000 | ORAL_TABLET | Freq: Two times a day (BID) | ORAL | 0 refills | Status: AC

## 2020-06-04 NOTE — Progress Notes (Signed)
Pt was called to go over televist intake before NP Kathe Becton call him, but, no one answered the phone and the VM was full.

## 2020-06-09 ENCOUNTER — Encounter: Payer: Self-pay | Admitting: Family Medicine

## 2020-06-14 DIAGNOSIS — Z20822 Contact with and (suspected) exposure to covid-19: Secondary | ICD-10-CM | POA: Diagnosis not present

## 2020-06-30 ENCOUNTER — Telehealth: Payer: Self-pay | Admitting: Family Medicine

## 2020-06-30 NOTE — Telephone Encounter (Signed)
Pt was called about awv w/ pcc. lvm to return call

## 2020-07-23 ENCOUNTER — Ambulatory Visit: Payer: Self-pay | Admitting: Family Medicine

## 2020-08-04 ENCOUNTER — Ambulatory Visit: Payer: BC Managed Care – PPO | Admitting: Family Medicine

## 2020-08-04 ENCOUNTER — Ambulatory Visit: Payer: Self-pay | Admitting: Family Medicine

## 2020-09-24 ENCOUNTER — Other Ambulatory Visit: Payer: Self-pay

## 2020-09-24 ENCOUNTER — Encounter: Payer: Self-pay | Admitting: Family Medicine

## 2020-09-24 ENCOUNTER — Ambulatory Visit (INDEPENDENT_AMBULATORY_CARE_PROVIDER_SITE_OTHER): Payer: BC Managed Care – PPO | Admitting: Family Medicine

## 2020-09-24 VITALS — BP 132/65 | HR 60 | Temp 97.4°F | Ht 68.0 in | Wt 225.0 lb

## 2020-09-24 DIAGNOSIS — Z86718 Personal history of other venous thrombosis and embolism: Secondary | ICD-10-CM | POA: Diagnosis not present

## 2020-09-24 DIAGNOSIS — Z09 Encounter for follow-up examination after completed treatment for conditions other than malignant neoplasm: Secondary | ICD-10-CM

## 2020-09-24 DIAGNOSIS — Z0000 Encounter for general adult medical examination without abnormal findings: Secondary | ICD-10-CM | POA: Diagnosis not present

## 2020-09-24 DIAGNOSIS — I10 Essential (primary) hypertension: Secondary | ICD-10-CM

## 2020-09-24 DIAGNOSIS — Z760 Encounter for issue of repeat prescription: Secondary | ICD-10-CM

## 2020-09-24 DIAGNOSIS — E785 Hyperlipidemia, unspecified: Secondary | ICD-10-CM | POA: Diagnosis not present

## 2020-09-24 MED ORDER — SIMVASTATIN 40 MG PO TABS: 40.0000 mg | ORAL_TABLET | Freq: Every day | ORAL | 3 refills | Status: DC

## 2020-09-24 NOTE — Progress Notes (Signed)
Patient Tommy Walters   Annual Physical   Subjective:  Patient ID: Tommy Walters, male    DOB: October 28, 1970  Age: 50 y.o. MRN: [id number]  CC:  Chief Complaint  Patient presents with  . Annual Exam   HPI Tommy Walters is a 50 year old male who presents for his Annual Physical today.   Patient Active Problem List   Diagnosis Date Noted  . History of DVT (deep vein thrombosis) 07/27/2019  . Left shoulder pain 12/21/2018  . Colon cancer screening 09/13/2018  . DVT (deep venous thrombosis) ([place]) 03/15/2016  . Essential hypertension 06/28/2014   Current Status: Since his last office visit, he is doing well with no complaints. He exercises daily from 30-60 minutes. He has had Colonoscopy 2 years ago at age 21, with removal of polyps, to follow up after 3 years. Denies GI problems such as diarrhea, and constipation. He has no reports of blood in stools, dysuria and hematuria. He does not perform regular Testicular exams. He denies visual changes, chest pain, cough, shortness of breath, heart palpitations, and falls. He has occasional headaches and dizziness with position changes. Denies severe headaches, confusion, seizures, double vision, and blurred vision, nausea and vomiting. He denies fevers, chills, fatigue, recent infections, weight loss, and night sweats. No depression or anxiety reported today. He is taking all medications as prescribed. He denies pain today.   Past Medical History:  Diagnosis Date  . DVT (deep venous thrombosis) ([place])   . History of colon polyps 06/2019  . Shortness of breath     Past Surgical History:  Procedure Laterality Date  . NO PAST SURGERIES      Family History  Problem Relation Age of Onset  . Hypertension Mother   . Diabetes Mother   . Stroke Mother 64       died from   . Colon cancer Neg Hx   . Colon polyps Neg Hx   . Esophageal cancer Neg Hx   . Rectal cancer Neg Hx   . Stomach cancer Neg Hx      Social History   Socioeconomic History  . Marital status: Married    Spouse name: [name]  . Number of children: 4  . Years of education: MS  . Highest education level: Not on file  Occupational History  . Occupation: Mudlogger   Tobacco Use  . Smoking status: Never Smoker  . Smokeless tobacco: Never Used  Vaping Use  . Vaping Use: Never used  Substance and Sexual Activity  . Alcohol use: Yes    Alcohol/week: 1.0 standard drink    Types: 1 Glasses of wine per week  . Drug use: No  . Sexual activity: Yes  Other Topics Concern  . Not on file  Social History Narrative   Lives with wife and 3 daughters.   Son is in school elsewhere.   Social Determinants of Health   Financial Resource Strain: Not on file  Food Insecurity: Not on file  Transportation Needs: Not on file  Physical Activity: Not on file  Stress: Not on file  Social Connections: Not on file  Intimate Partner Violence: Not on file    Outpatient Medications Prior to Visit  Medication Sig Dispense Refill  . acetaminophen (TYLENOL) 500 MG tablet Take 2 tablets (1,000 mg total) by mouth every 6 (six) hours as needed for mild pain, fever or headache. 30 tablet 5  . Boswellia-Glucosamine-Vit D (OSTEO BI-FLEX ONE  PER DAY PO) Take by mouth.    . Cholecalciferol (VITAMIN D3 PO) Take by mouth.    . Coenzyme Q10 (CO Q 10 PO) Take by mouth.    . Cyanocobalamin (B-12 PO) Take by mouth.    . famotidine (PEPCID) 20 MG tablet Take 1 tablet (20 mg total) by mouth 2 (two) times daily. 90 tablet 1  . Glucosamine Sulfate 1000 MG CAPS Take 1 capsule (1,000 mg total) by mouth 2 (two) times daily. 180 capsule 3  . Melatonin-Pyridoxine (MELATIN PO) Take by mouth.    . Multiple Vitamin (ONE-A-DAY MENS PO) Take by mouth.    . Na Sulfate-K Sulfate-Mg Sulf 17.5-3.13-1.6 GM/177ML SOLN Suprep (no substitutions)-TAKE AS DIRECTED. 354 mL 0  . TURMERIC PO Take by mouth.    . simvastatin (ZOCOR) 40 MG tablet TAKE 1 TABLET BY MOUTH  AT BEDTIME 90 tablet 3   No facility-administered medications prior to visit.    Allergies  Allergen Reactions  . Aspirin Other (See Comments)    Too much causes blood in stool.  . Caffeine Other (See Comments)    GI upset  . Cherry Nausea And Vomiting    ROS Review of Systems  Constitutional: Negative.   HENT: Negative.   Eyes: Negative.   Respiratory: Negative.   Cardiovascular: Negative.   Gastrointestinal: Negative.   Genitourinary: Negative.   Musculoskeletal: Negative.   Skin: Negative.   Allergic/Immunologic: Negative.   Neurological: Positive for dizziness (occasional) and headaches (occasional ).  Hematological: Negative.   Psychiatric/Behavioral: Negative.    Objective:    Physical Exam Vitals and nursing note reviewed.  Constitutional:      Appearance: Normal appearance.  HENT:     Head: Normocephalic and atraumatic.     Nose: Nose normal.     Mouth/Throat:     Mouth: Mucous membranes are moist.     Pharynx: Oropharynx is clear.  Cardiovascular:     Rate and Rhythm: Normal rate and regular rhythm.     Pulses: Normal pulses.     Heart sounds: Normal heart sounds.  Pulmonary:     Effort: Pulmonary effort is normal.     Breath sounds: Normal breath sounds.  Abdominal:     General: Bowel sounds are normal. There is distension (obese).     Palpations: Abdomen is soft.  Musculoskeletal:        General: Normal range of motion.     Cervical back: Normal range of motion and neck supple.  Skin:    General: Skin is warm and dry.  Neurological:     General: No focal deficit present.     Mental Status: He is alert and oriented to person, place, and time.  Psychiatric:        Mood and Affect: Mood normal.        Behavior: Behavior normal.        Thought Content: Thought content normal.        Judgment: Judgment normal.    BP 132/65 (BP Location: Left Arm, Patient Position: Sitting, Cuff Size: Normal)   Pulse 60   Temp (!) 97.4 F (36.3 C) (Temporal)    Ht 5\' 8"  (1.727 m)   Wt 225 lb (102.1 kg)   SpO2 99%   BMI 34.21 kg/m  Wt Readings from Last 3 Encounters:  09/24/20 225 lb (102.1 kg)  01/23/20 227 lb (103 kg)  07/25/19 224 lb 6.4 oz (101.8 kg)     Health Maintenance Due  Topic Date Due  .  Hepatitis C Screening  Never done  . COVID-19 Vaccine (1) Never done    There are no preventive Walters reminders to display for this patient.  Lab Results  Component Value Date   TSH 1.200 07/25/2019   Lab Results  Component Value Date   WBC 6.9 07/25/2019   HGB 15.0 07/25/2019   HCT 46.7 07/25/2019   MCV 85 07/25/2019   PLT 215 07/25/2019   Lab Results  Component Value Date   NA 140 07/25/2019   K 4.3 07/25/2019   CO2 25 07/25/2019   GLUCOSE 82 07/25/2019   BUN 9 07/25/2019   CREATININE 1.04 07/25/2019   BILITOT 0.4 07/25/2019   ALKPHOS 62 07/25/2019   AST 28 07/25/2019   ALT 23 07/25/2019   PROT 7.1 07/25/2019   ALBUMIN 4.5 07/25/2019   CALCIUM 9.8 07/25/2019   ANIONGAP 6 03/02/2016   Lab Results  Component Value Date   CHOL 155 07/25/2019   Lab Results  Component Value Date   HDL 60 07/25/2019   Lab Results  Component Value Date   LDLCALC 85 07/25/2019   Lab Results  Component Value Date   TRIG 46 07/25/2019   Lab Results  Component Value Date   CHOLHDL 2.6 07/25/2019   Lab Results  Component Value Date   HGBA1C 5.5 07/25/2019   Assessment & Plan:   1. Annual physical exam Physical assessment within normal for age.  Recommend monthly testicular exams. Recommend daily multivitamin for men Recommend strength training in 150 minutes of cardiovascular exercise per week - Urinalysis  2. Essential hypertension The current medical regimen is effective; blood pressure is stable at 132/65 today; continue present plan and medications as prescribed. He will continue to take medications as prescribed, to decrease high sodium intake, excessive alcohol intake, increase potassium intake, smoking cessation,  and increase physical activity of at least 30 minutes of cardio activity daily. He will continue to follow Heart Healthy or DASH diet.  3. History of DVT (deep vein thrombosis) No signs or symptoms of recurrence noted or reported.   4. Hyperlipidemia, unspecified hyperlipidemia type - simvastatin (ZOCOR) 40 MG tablet; Take 1 tablet (40 mg total) by mouth at bedtime.  Dispense: 90 tablet; Refill: 3  5. Medication refill - simvastatin (ZOCOR) 40 MG tablet; Take 1 tablet (40 mg total) by mouth at bedtime.  Dispense: 90 tablet; Refill: 3  6. Healthcare maintenance - CBC with Differential - Comprehensive metabolic panel - Lipid Panel - TSH - Vitamin B12 - Vitamin D, 25-hydroxy - PSA - Urinalysis  7. Follow up He will follow up in 1 year for Annual Physical and Labwork.   Meds ordered this encounter  Medications  . simvastatin (ZOCOR) 40 MG tablet    Sig: Take 1 tablet (40 mg total) by mouth at bedtime.    Dispense:  90 tablet    Refill:  3    Orders Placed This Encounter  Procedures  . CBC with Differential  . Comprehensive metabolic panel  . Lipid Panel  . TSH  . Vitamin B12  . Vitamin D, 25-hydroxy  . PSA  . Urinalysis    Referral Orders  No referral(s) requested today    Kathe Becton, MSN, ANE, FNP-BC [facility] [place] [address] [place], [place] [address] [phone] fax  Problem List Items Addressed This Visit      Cardiovascular and Mediastinum   Essential hypertension   Relevant Medications  simvastatin (ZOCOR) 40 MG tablet     Other   History of DVT (deep vein thrombosis)    Other Visit Diagnoses    Annual physical exam    -  Primary   Relevant Orders   Urinalysis   Hyperlipidemia, unspecified hyperlipidemia type       Relevant Medications   simvastatin (ZOCOR) 40 MG tablet   Medication refill       Relevant Medications   simvastatin  (ZOCOR) 40 MG tablet   Healthcare maintenance       Relevant Orders   CBC with Differential   Comprehensive metabolic panel   Lipid Panel   TSH   Vitamin B12   Vitamin D, 25-hydroxy   PSA   Urinalysis   Follow up          Meds ordered this encounter  Medications  . simvastatin (ZOCOR) 40 MG tablet    Sig: Take 1 tablet (40 mg total) by mouth at bedtime.    Dispense:  90 tablet    Refill:  3    Follow-up: No follow-ups on file.    Azzie Glatter, FNP

## 2020-09-25 LAB — URINALYSIS
Bilirubin, UA: NEGATIVE
Glucose, UA: NEGATIVE
Leukocytes,UA: NEGATIVE
Nitrite, UA: NEGATIVE
Protein,UA: NEGATIVE
RBC, UA: NEGATIVE
Specific Gravity, UA: 1.023 (ref 1.005–1.030)
Urobilinogen, Ur: 0.2 mg/dL (ref 0.2–1.0)
pH, UA: 6 (ref 5.0–7.5)

## 2020-09-25 LAB — COMPREHENSIVE METABOLIC PANEL
ALT: 38 IU/L (ref 0–44)
AST: 40 IU/L (ref 0–40)
Albumin/Globulin Ratio: 1.7 (ref 1.2–2.2)
Albumin: 4.7 g/dL (ref 3.8–4.9)
Alkaline Phosphatase: 62 IU/L (ref 44–121)
BUN/Creatinine Ratio: 9 (ref 9–20)
BUN: 11 mg/dL (ref 6–24)
Bilirubin Total: 0.4 mg/dL (ref 0.0–1.2)
CO2: 19 mmol/L — ABNORMAL LOW (ref 20–29)
Calcium: 9.6 mg/dL (ref 8.7–10.2)
Chloride: 103 mmol/L (ref 96–106)
Creatinine, Ser: 1.21 mg/dL (ref 0.76–1.27)
GFR calc Af Amer: 79 mL/min/{1.73_m2} (ref >59)
GFR calc non Af Amer: 68 mL/min/{1.73_m2} (ref >59)
Globulin, Total: 2.8 g/dL (ref 1.5–4.5)
Glucose: 103 mg/dL — ABNORMAL HIGH (ref 65–99)
Potassium: 4.8 mmol/L (ref 3.5–5.2)
Sodium: 141 mmol/L (ref 134–144)
Total Protein: 7.5 g/dL (ref 6.0–8.5)

## 2020-09-25 LAB — CBC WITH DIFFERENTIAL/PLATELET
Basophils Absolute: 0 10*3/uL (ref 0.0–0.2)
Basos: 1 %
EOS (ABSOLUTE): 0.2 10*3/uL (ref 0.0–0.4)
Eos: 4 %
Hematocrit: 45.7 % (ref 37.5–51.0)
Hemoglobin: 14.7 g/dL (ref 13.0–17.7)
Immature Grans (Abs): 0 10*3/uL (ref 0.0–0.1)
Immature Granulocytes: 0 %
Lymphocytes Absolute: 2.6 10*3/uL (ref 0.7–3.1)
Lymphs: 42 %
MCH: 27.2 pg (ref 26.6–33.0)
MCHC: 32.2 g/dL (ref 31.5–35.7)
MCV: 85 fL (ref 79–97)
Monocytes Absolute: 0.6 10*3/uL (ref 0.1–0.9)
Monocytes: 9 %
Neutrophils Absolute: 2.8 10*3/uL (ref 1.4–7.0)
Neutrophils: 44 %
Platelets: 215 10*3/uL (ref 150–450)
RBC: 5.41 x10E6/uL (ref 4.14–5.80)
RDW: 12.1 % (ref 11.6–15.4)
WBC: 6.2 10*3/uL (ref 3.4–10.8)

## 2020-09-25 LAB — LIPID PANEL
Chol/HDL Ratio: 3 ratio (ref 0.0–5.0)
Cholesterol, Total: 147 mg/dL (ref 100–199)
HDL: 49 mg/dL (ref >39)
LDL Chol Calc (NIH): 86 mg/dL (ref 0–99)
Triglycerides: 60 mg/dL (ref 0–149)
VLDL Cholesterol Cal: 12 mg/dL (ref 5–40)

## 2020-09-25 LAB — PSA: Prostate Specific Ag, Serum: 0.9 ng/mL (ref 0.0–4.0)

## 2020-09-25 LAB — VITAMIN D 25 HYDROXY (VIT D DEFICIENCY, FRACTURES): Vit D, 25-Hydroxy: 27.8 ng/mL — ABNORMAL LOW (ref 30.0–100.0)

## 2020-09-25 LAB — TSH: TSH: 1.83 u[IU]/mL (ref 0.450–4.500)

## 2020-09-25 LAB — VITAMIN B12: Vitamin B-12: >2000 pg/mL — ABNORMAL HIGH (ref 232–1245)

## 2020-09-30 ENCOUNTER — Encounter: Payer: Self-pay | Admitting: Family Medicine

## 2020-10-06 ENCOUNTER — Telehealth: Payer: Self-pay | Admitting: Family Medicine

## 2020-10-06 NOTE — Telephone Encounter (Signed)
Pt request return call from provider to inform / discuss / new symptoms when pt sits down. Call pt [phone]

## 2020-10-06 NOTE — Telephone Encounter (Signed)
Forwarding

## 2020-11-19 ENCOUNTER — Encounter: Payer: Self-pay | Admitting: Family Medicine

## 2020-12-07 DIAGNOSIS — J069 Acute upper respiratory infection, unspecified: Secondary | ICD-10-CM | POA: Insufficient documentation

## 2020-12-28 ENCOUNTER — Telehealth: Payer: Self-pay

## 2020-12-28 ENCOUNTER — Other Ambulatory Visit: Payer: Self-pay

## 2020-12-28 DIAGNOSIS — Z760 Encounter for issue of repeat prescription: Secondary | ICD-10-CM

## 2020-12-28 DIAGNOSIS — E785 Hyperlipidemia, unspecified: Secondary | ICD-10-CM

## 2020-12-28 MED ORDER — SIMVASTATIN 40 MG PO TABS: 40.0000 mg | ORAL_TABLET | Freq: Every day | ORAL | 2 refills | Status: DC

## 2020-12-28 NOTE — Telephone Encounter (Signed)
Med refill  Cholesterol medicine (x3)

## 2020-12-28 NOTE — Telephone Encounter (Signed)
Sent refill to pharmacy. 

## 2021-05-26 ENCOUNTER — Encounter: Payer: Self-pay | Admitting: Nurse Practitioner

## 2021-05-26 ENCOUNTER — Other Ambulatory Visit: Payer: Self-pay

## 2021-05-26 ENCOUNTER — Telehealth (INDEPENDENT_AMBULATORY_CARE_PROVIDER_SITE_OTHER): Payer: BC Managed Care – PPO | Admitting: Nurse Practitioner

## 2021-05-26 ENCOUNTER — Ambulatory Visit: Payer: BC Managed Care – PPO | Admitting: Nurse Practitioner

## 2021-05-26 DIAGNOSIS — E785 Hyperlipidemia, unspecified: Secondary | ICD-10-CM

## 2021-05-26 DIAGNOSIS — I10 Essential (primary) hypertension: Secondary | ICD-10-CM

## 2021-05-26 DIAGNOSIS — B999 Unspecified infectious disease: Secondary | ICD-10-CM | POA: Diagnosis not present

## 2021-05-26 DIAGNOSIS — R519 Headache, unspecified: Secondary | ICD-10-CM | POA: Diagnosis not present

## 2021-05-26 MED ORDER — SIMVASTATIN 40 MG PO TABS: 40.0000 mg | ORAL_TABLET | Freq: Every day | ORAL | 3 refills | Status: DC

## 2021-05-26 NOTE — Progress Notes (Signed)
   [place], [place]  [address] Virtual Visit via Video Note  I connected with NAYDEN CZAJKA on 05/26/21 at  2:40 PM EDT by video and verified that I am speaking with the correct person using two identifiers.   I discussed the limitations, risks, security and privacy concerns of performing an evaluation and management service by video and the availability of in person appointments. I also discussed with the patient that there may be a patient responsible charge related to this service. The patient expressed understanding and agreed to proceed.  Patient home Provider Office  History of Present Illness:  has a past medical history of DVT (deep venous thrombosis) ([place]), History of colon polyps (06/2019), and Shortness of breath.   HPI   Upper Respiratory Infection Patient complains of possible sinusitis. Symptoms include headache described as comes and goes. He feels like it maybe related to his cholesterol. He has a history of elevated BP on no current treatment. Denies headache, dizziness, visual changes, shortness of breath, dyspnea on exertion, chest pain, nausea, vomiting or any edema.  He reports 4-5 hours of sleep. This maybe related to his wives recent surgery.  . Onset of symptoms was 2 days ago, and has been unchanged since that time. Treatment to date:  APAP somewhat effective short lived . He does not feel like it is COVID. He had COVID one year ago,.   ROS    Observations/Objective: Virtual visit no exam   Assessment and Plan: 1. Hyperlipidemia, unspecified hyperlipidemia type - simvastatin (ZOCOR) 40 MG tablet; Take 1 tablet (40 mg total) by mouth at bedtime.  Dispense: 90 tablet; Refill: 3 - Lipid panel  2. Essential hypertension Stable Diet controlled.  Encouraged home monitoring and recording BP <130/80 Continue eating a heart-healthy diet with less salt Encouraged regular physical activity  Recommend Weight loss -  Comp. Metabolic Panel (12)  3. Headache associated with infection - CBC with Differential/Platelet Continue to monitor follow up if symptoms change    Follow Up Instructions: Return in about 6 weeks (around 07/07/2021) for follow up [address].    I discussed the assessment and treatment plan with the patient. The patient was provided an opportunity to ask questions and all were answered. The patient agreed with the plan and demonstrated an understanding of the instructions.   The patient was advised to call back or seek an in-person evaluation if the symptoms worsen or if the condition fails to improve as anticipated.  I provided 17 minutes of video- visit time during this encounter.   Vevelyn Francois, NP       .

## 2021-05-26 NOTE — Patient Instructions (Signed)

## 2021-05-27 LAB — CBC WITH DIFFERENTIAL/PLATELET
Basophils Absolute: 0.1 10*3/uL (ref 0.0–0.2)
Basos: 1 %
EOS (ABSOLUTE): 0.5 10*3/uL — ABNORMAL HIGH (ref 0.0–0.4)
Eos: 8 %
Hematocrit: 44.5 % (ref 37.5–51.0)
Hemoglobin: 14.5 g/dL (ref 13.0–17.7)
Immature Grans (Abs): 0 10*3/uL (ref 0.0–0.1)
Immature Granulocytes: 0 %
Lymphocytes Absolute: 2.7 10*3/uL (ref 0.7–3.1)
Lymphs: 41 %
MCH: 27.1 pg (ref 26.6–33.0)
MCHC: 32.6 g/dL (ref 31.5–35.7)
MCV: 83 fL (ref 79–97)
Monocytes Absolute: 0.7 10*3/uL (ref 0.1–0.9)
Monocytes: 10 %
Neutrophils Absolute: 2.7 10*3/uL (ref 1.4–7.0)
Neutrophils: 40 %
Platelets: 226 10*3/uL (ref 150–450)
RBC: 5.36 x10E6/uL (ref 4.14–5.80)
RDW: 12.6 % (ref 11.6–15.4)
WBC: 6.7 10*3/uL (ref 3.4–10.8)

## 2021-05-27 LAB — COMP. METABOLIC PANEL (12)
AST: 23 IU/L (ref 0–40)
Albumin/Globulin Ratio: 1.5 (ref 1.2–2.2)
Albumin: 4.4 g/dL (ref 3.8–4.9)
Alkaline Phosphatase: 66 IU/L (ref 44–121)
BUN/Creatinine Ratio: 12 (ref 9–20)
BUN: 12 mg/dL (ref 6–24)
Bilirubin Total: 0.5 mg/dL (ref 0.0–1.2)
Calcium: 9.3 mg/dL (ref 8.7–10.2)
Chloride: 103 mmol/L (ref 96–106)
Creatinine, Ser: 1.02 mg/dL (ref 0.76–1.27)
Globulin, Total: 3 g/dL (ref 1.5–4.5)
Glucose: 79 mg/dL (ref 70–99)
Potassium: 4.5 mmol/L (ref 3.5–5.2)
Sodium: 140 mmol/L (ref 134–144)
Total Protein: 7.4 g/dL (ref 6.0–8.5)
eGFR: 88 mL/min/{1.73_m2} (ref >59)

## 2021-05-27 LAB — LIPID PANEL
Chol/HDL Ratio: 3.9 ratio (ref 0.0–5.0)
Cholesterol, Total: 162 mg/dL (ref 100–199)
HDL: 42 mg/dL
LDL Chol Calc (NIH): 109 mg/dL — ABNORMAL HIGH (ref 0–99)
Triglycerides: 55 mg/dL (ref 0–149)
VLDL Cholesterol Cal: 11 mg/dL (ref 5–40)

## 2021-06-16 ENCOUNTER — Ambulatory Visit: Payer: Self-pay | Admitting: Nurse Practitioner

## 2021-06-16 ENCOUNTER — Telehealth: Payer: BC Managed Care – PPO | Admitting: Nurse Practitioner

## 2021-07-15 ENCOUNTER — Ambulatory Visit (INDEPENDENT_AMBULATORY_CARE_PROVIDER_SITE_OTHER): Payer: BC Managed Care – PPO | Admitting: Nurse Practitioner

## 2021-07-15 ENCOUNTER — Encounter: Payer: Self-pay | Admitting: Nurse Practitioner

## 2021-07-15 ENCOUNTER — Other Ambulatory Visit: Payer: Self-pay

## 2021-07-15 VITALS — BP 132/70 | HR 65 | Temp 98.0°F | Ht 68.0 in | Wt 235.2 lb

## 2021-07-15 DIAGNOSIS — Z86718 Personal history of other venous thrombosis and embolism: Secondary | ICD-10-CM

## 2021-07-15 DIAGNOSIS — E785 Hyperlipidemia, unspecified: Secondary | ICD-10-CM | POA: Diagnosis not present

## 2021-07-15 DIAGNOSIS — I10 Essential (primary) hypertension: Secondary | ICD-10-CM

## 2021-07-15 NOTE — Progress Notes (Signed)
Tommy Walters, [place]  [address] Phone:  [phone]   Fax:  [phone]   Established Patient Office Visit  Subjective:  Patient ID: Tommy Walters, male    DOB: 1971/03/10  Age: 50 y.o. MRN: [id number]  CC:  Chief Complaint  Patient presents with   Follow-up    Pt is here today for his follow visit today. No concerns or issues to discuss.    HPI Tommy Walters presents for follow up. He  has a past medical history of DVT (deep venous thrombosis) ([place]), History of colon polyps (06/2019), and Shortness of breath.   He is in today for follow up. He has noticed this is elevated at home.   Dyslipidemia Patient presents for evaluation of lipids. Compliance with treatment thus far has been good. A repeat fasting lipid profile was done. The patient does not use medications that may worsen dyslipidemias (corticosteroids, progestins, anabolic steroids, diuretics, beta-blockers, amiodarone, cyclosporine, olanzapine). The patient exercises daily.  The patient is not known to have coexisting coronary artery disease.   Cardiac Risk Factors Age > 45-male, > 55-male:  YES  +1  Smoking:   NO  Sig. family hx of CHD*:  NO  Hypertension:   YES  +1  Diabetes:   NO  HDL < 35:   NO  HDL > 59:   NO  Total: 2  *- Significant family history of Coronary Heart Disease per National Cholesterol Education Program = Myocardial Infarction or sudden death at less than 85 years old in  father or other 1st-degree male relative, or less than 13 years old in mother or  other 1st-degree male relative.  Past Medical History:  Diagnosis Date   DVT (deep venous thrombosis) ([facility])    History of colon polyps 06/2019   Shortness of breath     Past Surgical History:  Procedure Laterality Date   NO PAST SURGERIES      Family History  Problem Relation Age of Onset   Hypertension Mother    Diabetes Mother    Stroke Mother 54       died from    Colon  cancer Neg Hx    Colon polyps Neg Hx    Esophageal cancer Neg Hx    Rectal cancer Neg Hx    Stomach cancer Neg Hx     Social History   Socioeconomic History   Marital status: Married    Spouse name: [name]   Number of children: 4   Years of education: MS   Highest education level: Not on file  Occupational History   Occupation: Mudlogger   Tobacco Use   Smoking status: Never   Smokeless tobacco: Never  Vaping Use   Vaping Use: Never used  Substance and Sexual Activity   Alcohol use: Yes    Alcohol/week: 1.0 standard drink    Types: 1 Glasses of wine per week    Comment: occ   Drug use: No   Sexual activity: Yes    Birth control/protection: None  Other Topics Concern   Not on file  Social History Narrative   Lives with wife and 3 daughters.   Son is in school elsewhere.   Social Determinants of Health   Financial Resource Strain: Not on file  Food Insecurity: Not on file  Transportation Needs: Not on file  Physical Activity: Not on file  Stress: Not on file  Social Connections: Not on file  Intimate  Partner Violence: Not on file    Outpatient Medications Prior to Visit  Medication Sig Dispense Refill   acetaminophen (TYLENOL) 500 MG tablet Take 2 tablets (1,000 mg total) by mouth every 6 (six) hours as needed for mild pain, fever or headache. 30 tablet 5   Boswellia-Glucosamine-Vit D (OSTEO BI-FLEX ONE PER DAY PO) Take by mouth.     Cholecalciferol (VITAMIN D3 PO) Take by mouth.     Coenzyme Q10 (CO Q 10 PO) Take by mouth.     Cyanocobalamin (B-12 PO) Take by mouth.     famotidine (PEPCID) 20 MG tablet Take 1 tablet (20 mg total) by mouth 2 (two) times daily. 90 tablet 1   Glucosamine Sulfate 1000 MG CAPS Take 1 capsule (1,000 mg total) by mouth 2 (two) times daily. 180 capsule 3   Melatonin-Pyridoxine (MELATIN PO) Take by mouth.     Multiple Vitamin (ONE-A-DAY MENS PO) Take by mouth.     Na Sulfate-K Sulfate-Mg Sulf 17.5-3.13-1.6 GM/177ML SOLN Suprep  (no substitutions)-TAKE AS DIRECTED. 354 mL 0   ranitidine (ZANTAC) 75 MG tablet Take by mouth.     simvastatin (ZOCOR) 40 MG tablet Take 1 tablet (40 mg total) by mouth at bedtime. 90 tablet 3   TURMERIC PO Take by mouth.     No facility-administered medications prior to visit.    Allergies  Allergen Reactions   Aspirin Other (See Comments)    Too much causes blood in stool.   Caffeine Other (See Comments)    GI upset   Cherry Nausea And Vomiting    ROS Review of Systems    Objective:    Physical Exam Constitutional:      Appearance: He is obese.  HENT:     Head: Normocephalic and atraumatic.  Cardiovascular:     Rate and Rhythm: Normal rate and regular rhythm.     Pulses: Normal pulses.     Heart sounds: Normal heart sounds.  Pulmonary:     Effort: Pulmonary effort is normal.     Breath sounds: Normal breath sounds.  Musculoskeletal:        General: Normal range of motion.     Cervical back: Normal range of motion.     Right lower leg: No edema.     Left lower leg: No edema.  Skin:    General: Skin is warm and dry.     Capillary Refill: Capillary refill takes less than 2 seconds.  Neurological:     General: No focal deficit present.     Mental Status: He is alert and oriented to person, place, and time.  Psychiatric:        Mood and Affect: Mood normal.        Behavior: Behavior normal.        Thought Content: Thought content normal.        Judgment: Judgment normal.    BP 132/70   Pulse 65   Temp 98 F (36.7 C)   Ht $R'5\' 8"'hC$  (1.727 m)   Wt 235 lb 3.2 oz (106.7 kg)   SpO2 99%   BMI 35.76 kg/m  Wt Readings from Last 3 Encounters:  07/15/21 235 lb 3.2 oz (106.7 kg)  09/24/20 225 lb (102.1 kg)  01/23/20 227 lb (103 kg)     Health Maintenance Due  Topic Date Due   Hepatitis C Screening  Never done   Zoster Vaccines- Shingrix (1 of 2) Never done   COVID-19 Vaccine (4 - Booster for [employer]  series) 06/25/2021    There are no preventive care  reminders to display for this patient.  Lab Results  Component Value Date   TSH 1.830 09/24/2020   Lab Results  Component Value Date   WBC 6.7 05/26/2021   HGB 14.5 05/26/2021   HCT 44.5 05/26/2021   MCV 83 05/26/2021   PLT 226 05/26/2021   Lab Results  Component Value Date   NA 140 05/26/2021   K 4.5 05/26/2021   CO2 19 (L) 09/24/2020   GLUCOSE 79 05/26/2021   BUN 12 05/26/2021   CREATININE 1.02 05/26/2021   BILITOT 0.5 05/26/2021   ALKPHOS 66 05/26/2021   AST 23 05/26/2021   ALT 38 09/24/2020   PROT 7.4 05/26/2021   ALBUMIN 4.4 05/26/2021   CALCIUM 9.3 05/26/2021   ANIONGAP 6 03/02/2016   EGFR 88 05/26/2021   Lab Results  Component Value Date   CHOL 162 05/26/2021   Lab Results  Component Value Date   HDL 42 05/26/2021   Lab Results  Component Value Date   LDLCALC 109 (H) 05/26/2021   Lab Results  Component Value Date   TRIG 55 05/26/2021   Lab Results  Component Value Date   CHOLHDL 3.9 05/26/2021   Lab Results  Component Value Date   HGBA1C 5.5 07/25/2019      Assessment & Plan:   Problem List Items Addressed This Visit       Cardiovascular and Mediastinum   Essential hypertension Stable  Encouraged on going compliance with current medication regimen Encouraged home monitoring and recording BP <130/80 Eating a heart-healthy diet with less salt Encouraged regular physical activity  Recommend Weight loss       Other   History of DVT (deep vein thrombosis)'\  Discussed risk   Hyperlipidemia - Primary Stable Continue with current regimen.  No changes warranted. Good patient compliance.     No orders of the defined types were placed in this encounter.   Follow-up: No follow-ups on file.    Vevelyn Francois, NP

## 2021-07-15 NOTE — Patient Instructions (Addendum)

## 2021-07-22 ENCOUNTER — Telehealth: Payer: Self-pay

## 2021-07-22 ENCOUNTER — Other Ambulatory Visit: Payer: Self-pay

## 2021-07-22 ENCOUNTER — Other Ambulatory Visit: Payer: Self-pay | Admitting: Nurse Practitioner

## 2021-07-22 ENCOUNTER — Encounter (HOSPITAL_COMMUNITY): Payer: Self-pay | Admitting: Emergency Medicine

## 2021-07-22 ENCOUNTER — Emergency Department (HOSPITAL_COMMUNITY)
Admission: EM | Admit: 2021-07-22 | Discharge: 2021-07-22 | Disposition: A | Discharge status: Home / Self Care | Payer: BC Managed Care – PPO | Attending: Emergency Medicine | Admitting: Emergency Medicine

## 2021-07-22 ENCOUNTER — Ambulatory Visit (HOSPITAL_BASED_OUTPATIENT_CLINIC_OR_DEPARTMENT_OTHER)
Admission: RE | Admit: 2021-07-22 | Discharge: 2021-07-22 | Disposition: A | Discharge status: Home / Self Care | Payer: BC Managed Care – PPO | Source: Ambulatory Visit | Attending: Nurse Practitioner | Admitting: Nurse Practitioner

## 2021-07-22 DIAGNOSIS — M79662 Pain in left lower leg: Secondary | ICD-10-CM

## 2021-07-22 DIAGNOSIS — I82492 Acute embolism and thrombosis of other specified deep vein of left lower extremity: Secondary | ICD-10-CM | POA: Insufficient documentation

## 2021-07-22 DIAGNOSIS — Z86718 Personal history of other venous thrombosis and embolism: Secondary | ICD-10-CM

## 2021-07-22 DIAGNOSIS — M7989 Other specified soft tissue disorders: Secondary | ICD-10-CM

## 2021-07-22 DIAGNOSIS — I10 Essential (primary) hypertension: Secondary | ICD-10-CM | POA: Insufficient documentation

## 2021-07-22 MED ORDER — APIXABAN 5 MG PO TABS: 5.0000 mg | ORAL_TABLET | Freq: Two times a day (BID) | ORAL | 0 refills | Status: DC

## 2021-07-22 NOTE — Progress Notes (Signed)
Lower extremity venous has been completed.   Preliminary results in CV Proc.   Tommy Walters 07/22/2021 4:04 PM

## 2021-07-22 NOTE — Addendum Note (Signed)
Addended by: Thressa Sheller on: 07/22/2021 12:13 PM   Modules accepted: Orders

## 2021-07-22 NOTE — Telephone Encounter (Signed)
Patient Tommy Walters scheduled today 3:30 pm at [facility]. Patient notified.

## 2021-07-22 NOTE — Progress Notes (Signed)
   [facility] [address] [place], [place]  [address] Phone:  [phone]   Fax:  [phone] 

## 2021-07-22 NOTE — Telephone Encounter (Signed)
Patient called he stated he is having pain in his left leg. Patient stated he had a history of blood clots and want to make sure everything is okay,patient is requesting a referral to be sent somewhere so he can get a ultrasound or scan call back:[phone]

## 2021-07-22 NOTE — Discharge Instructions (Addendum)
Your history, exam, and ultrasound today confirmed recurrence of your DVT.  After speaking with you and pharmacy, they agree that Eliquis is an appropriate anticoagulation regimen.  Please take this and follow-up with your primary doctor.  Please rest and stay hydrated.  If any symptoms change or worsen or he develop any chest pain or shortness of breath, please turn to the nearest emergency department.

## 2021-07-22 NOTE — ED Triage Notes (Signed)
Patient with three day history of left calf pain sent to [facility] with DVT positive result on ultrasound, result available in Epic. Patient denies shortness of breath, is alert, oriented, ambulatory, and in no apparent distress at this time. Patient does not currently take a blood thinner.

## 2021-07-22 NOTE — Telephone Encounter (Signed)
Megan from [facility] vascular lab called she stated she has patients results. Patient is positive for DVT in gastral veins she is requesting a call back regarding plan of care asap call back:[phone]

## 2021-07-22 NOTE — ED Provider Notes (Signed)
[facility] EMERGENCY DEPARTMENT Provider Note   CSN: [id number] Arrival date & time: 07/22/21  1626     History Chief Complaint  Patient presents with   DVT    Efren P Camargo is a 50 y.o. male.  The history is provided by the patient and medical records. No language interpreter was used.  Leg Pain Location:  Leg Time since incident:  3 days Injury: no   Leg location:  L lower leg Pain details:    Quality:  Cramping and aching   Radiates to:  Does not radiate   Severity:  Moderate   Timing:  Constant   Progression:  Unchanged Chronicity:  New Prior injury to area:  No Relieved by:  Nothing Worsened by:  Nothing Ineffective treatments:  None tried Associated symptoms: swelling   Associated symptoms: no back pain, no fatigue, no fever, no numbness and no stiffness       Past Medical History:  Diagnosis Date   DVT (deep venous thrombosis) ([place])    History of colon polyps 06/2019   Shortness of breath     Patient Active Problem List   Diagnosis Date Noted   Viral upper respiratory illness 12/07/2020   Hyperlipidemia 10/28/2019   Influenza vaccination declined by patient 10/28/2019   History of DVT (deep vein thrombosis) 07/27/2019   Left shoulder pain 12/21/2018   Prostate cancer screening 09/13/2018   DVT (deep venous thrombosis) ([place]) 03/15/2016   Essential hypertension 06/28/2014    Past Surgical History:  Procedure Laterality Date   NO PAST SURGERIES         Family History  Problem Relation Age of Onset   Hypertension Mother    Diabetes Mother    Stroke Mother 32       died from    Colon cancer Neg Hx    Colon polyps Neg Hx    Esophageal cancer Neg Hx    Rectal cancer Neg Hx    Stomach cancer Neg Hx     Social History   Tobacco Use   Smoking status: Never   Smokeless tobacco: Never  Vaping Use   Vaping Use: Never used  Substance Use Topics   Alcohol use: Yes    Alcohol/week: 1.0 standard drink    Types: 1  Glasses of wine per week    Comment: occ   Drug use: No    Home Medications Prior to Admission medications   Medication Sig Start Date End Date Taking? Authorizing Provider  Cholecalciferol (VITAMIN D3 PO) Take 1 tablet by mouth daily.   Yes [provider]  Coenzyme Q10 (CO Q 10 PO) Take 1 tablet by mouth daily.   Yes [provider]  Cyanocobalamin (B-12 PO) Take 1 tablet by mouth daily.   Yes [provider]  Glucosamine Sulfate 1000 MG CAPS Take 1 capsule (1,000 mg total) by mouth 2 (two) times daily. 01/31/19  Yes Hilts, Legrand Como, MD  Multiple Vitamin (ONE-A-DAY MENS PO) Take by mouth.   Yes [provider]  simvastatin (ZOCOR) 40 MG tablet Take 1 tablet (40 mg total) by mouth at bedtime. 05/26/21  Yes Vevelyn Francois, NP  acetaminophen (TYLENOL) 500 MG tablet Take 2 tablets (1,000 mg total) by mouth every 6 (six) hours as needed for mild pain, fever or headache. Patient not taking: Reported on 07/22/2021 03/11/18   Azzie Glatter, FNP  Boswellia-Glucosamine-Vit D (OSTEO BI-FLEX ONE PER DAY PO) Take by mouth. Patient not taking: Reported on 07/22/2021  [provider]  famotidine (PEPCID) 20 MG tablet Take 1 tablet (20 mg total) by mouth 2 (two) times daily. Patient not taking: Reported on 07/22/2021 06/21/18   Azzie Glatter, FNP  Melatonin-Pyridoxine (MELATIN PO) Take by mouth. Patient not taking: Reported on 07/22/2021    [provider]  Na Sulfate-K Sulfate-Mg Sulf 17.5-3.13-1.6 GM/177ML SOLN Suprep (no substitutions)-TAKE AS DIRECTED. Patient not taking: Reported on 07/22/2021 06/13/19   Thornton Park, MD    Allergies    Aspirin, Caffeine, and Cherry  Review of Systems   Review of Systems  Constitutional:  Negative for chills, diaphoresis, fatigue and fever.  HENT:  Negative for congestion.   Respiratory:  Negative for cough, chest tightness, shortness of breath and wheezing.   Cardiovascular:  Positive for leg  swelling. Negative for chest pain and palpitations.  Gastrointestinal:  Negative for constipation, diarrhea and nausea.  Musculoskeletal:  Negative for back pain and stiffness.  Skin:  Negative for wound.  Neurological:  Negative for headaches.  Psychiatric/Behavioral:  Negative for agitation.   All other systems reviewed and are negative.  Physical Exam Updated Vital Signs BP (!) 166/73 (BP Location: Right Arm)   Pulse 70   Temp 98.3 F (36.8 C)   Resp 18   SpO2 99%   Physical Exam Vitals and nursing note reviewed.  Constitutional:      General: He is not in acute distress.    Appearance: He is well-developed. He is not ill-appearing, toxic-appearing or diaphoretic.  HENT:     Head: Normocephalic and atraumatic.  Eyes:     Conjunctiva/sclera: Conjunctivae normal.  Cardiovascular:     Rate and Rhythm: Normal rate and regular rhythm.     Heart sounds: No murmur heard. Pulmonary:     Effort: Pulmonary effort is normal. No respiratory distress.     Breath sounds: Normal breath sounds. No wheezing, rhonchi or rales.  Chest:     Chest wall: No tenderness.  Abdominal:     Palpations: Abdomen is soft.     Tenderness: There is no abdominal tenderness.  Musculoskeletal:        General: Tenderness present. No swelling.     Cervical back: Neck supple.     Right lower leg: No edema.     Left lower leg: No edema.  Skin:    General: Skin is warm and dry.     Capillary Refill: Capillary refill takes less than 2 seconds.     Findings: No erythema.  Neurological:     General: No focal deficit present.     Mental Status: He is alert.  Psychiatric:        Mood and Affect: Mood normal.    ED Results / Procedures / Treatments   Labs (all labs ordered are listed, but only abnormal results are displayed) Labs Reviewed - No data to display  EKG None  Radiology VAS [place] LOWER EXTREMITY VENOUS (DVT)  Result Date: 07/22/2021  Lower Venous DVT Study Patient Name:  [facility]   Date of Exam:   07/22/2021 Medical Rec #: [id number]         Accession #:    [id number] Date of Birth: 25-Jun-1971         Patient Gender: M Patient Age:   53 years Exam Location:  [facility] Procedure:      VAS [place] LOWER EXTREMITY VENOUS (DVT) Referring Phys: Dionisio David --------------------------------------------------------------------------------  Performing Technologist: Archie Patten RVS  Examination Guidelines: A complete evaluation includes  B-mode imaging, spectral Doppler, color Doppler, and power Doppler as needed of all accessible portions of each vessel. Bilateral testing is considered an integral part of a complete examination. Limited examinations for reoccurring indications may be performed as noted. The reflux portion of the exam is performed with the patient in reverse Trendelenburg.  +-----+---------------+---------+-----------+----------+--------------+ RIGHTCompressibilityPhasicitySpontaneityPropertiesThrombus Aging +-----+---------------+---------+-----------+----------+--------------+ CFV  Full           Yes      Yes                                 +-----+---------------+---------+-----------+----------+--------------+   +---------+---------------+---------+-----------+----------+-----------------+ LEFT     CompressibilityPhasicitySpontaneityPropertiesThrombus Aging    +---------+---------------+---------+-----------+----------+-----------------+ CFV      Full           Yes      Yes                                    +---------+---------------+---------+-----------+----------+-----------------+ SFJ      Full                                                           +---------+---------------+---------+-----------+----------+-----------------+ FV Prox  Full                                                           +---------+---------------+---------+-----------+----------+-----------------+ FV Mid   Full                                                            +---------+---------------+---------+-----------+----------+-----------------+ FV DistalFull                                                           +---------+---------------+---------+-----------+----------+-----------------+ PFV      Full                                                           +---------+---------------+---------+-----------+----------+-----------------+ POP      Full           Yes      Yes                                    +---------+---------------+---------+-----------+----------+-----------------+ PTV      Full                                                           +---------+---------------+---------+-----------+----------+-----------------+  PERO     Full                                                           +---------+---------------+---------+-----------+----------+-----------------+ Gastroc  None                                         Age Indeterminate +---------+---------------+---------+-----------+----------+-----------------+     Summary: RIGHT: - No evidence of common femoral vein obstruction.  LEFT: - Findings consistent with age indeterminate deep vein thrombosis involving the left gastrocnemius veins. - No cystic structure found in the popliteal fossa.  *See table(s) above for measurements and observations.    Preliminary     Procedures Procedures   Medications Ordered in ED Medications - No data to display  ED Course  I have reviewed the triage vital signs and the nursing notes.  Pertinent labs & imaging results that were available during my care of the patient were reviewed by me and considered in my medical decision making (see chart for details).    MDM Rules/Calculators/A&P                           Chadric RICHMOND COLDREN is a 50 y.o. male with a previous history significant for hyperlipidemia, hypertension, and previous DVT who presents with left calf pain and ultrasound showing DVT.   According to patient, several years ago he was on 6 months of anticoagulation due to DVT.  He was on Xarelto at that time.  He denies history of any pulmonary emboli and otherwise has been feeling well.  He says her last 2 days he has had pain in his left calf which he was concerned about.  He had ultrasound done today which showed DVT.  Patient was sent in for evaluation.  He denies any chest pain, shortness breath, fevers, chills, nausea, vomiting, or other complaints.  Denies trauma.  Denies any foot pain.  Denies numbness, tingling, weakness.  Patient reports the pain is mild to moderate and is still able to ambulate.  On exam, lungs clear and chest nontender.  Abdomen nontender.  Patient has intact DP and PT pulse in the left foot.  No tenderness in the knee or hip.  Normal gait.  Is ultrasound confirmed DVT I do feel patient needs anticoagulation given the recurrence.  He denies any evidence or symptoms of pulmonary embolism.  He is otherwise feeling well.  We had discussion about anticoagulation.  We offered him Xarelto since he has been on this in the past but he would need some blood work to check his kidney function.  He would rather do Eliquis and follow-up with his primary doctor.  Confirm with pharmacy this plan and we will start Eliquis and he will follow-up with his PCP.  He understood return precautions and plan of care and was discharged in good condition.    Final Clinical Impression(s) / ED Diagnoses Final diagnoses:  Acute deep vein thrombosis (DVT) of other specified vein of left lower extremity (HCC)  Pain of left calf    Rx / DC Orders ED Discharge Orders          Ordered    apixaban (  ELIQUIS) 5 MG TABS tablet  2 times daily        07/22/21 2002           Clinical Impression: 1. Acute deep vein thrombosis (DVT) of other specified vein of left lower extremity (HCC)   2. Pain of left calf     Disposition: Discharge  Condition: Good  I have discussed the  results, Dx and Tx plan with the pt(& family if present). He/she/they expressed understanding and agree(s) with the plan. Discharge instructions discussed at great length. Strict return precautions discussed and pt &/or family have verbalized understanding of the instructions. No further questions at time of discharge.    New Prescriptions   APIXABAN (ELIQUIS) 5 MG TABS TABLET    Take 1 tablet (5 mg total) by mouth 2 (two) times daily.    Follow Up: Vevelyn Francois, NP [address] [place] [address] [phone]        Siniya Lichty, Gwenyth Allegra, MD 07/22/21 2024

## 2021-08-03 ENCOUNTER — Telehealth: Payer: Self-pay

## 2021-08-03 NOTE — Telephone Encounter (Signed)
Patient was started on Eliquis on 12/9, he left voicemail stating he is having a reaction to that medication sx: upset stomach and blood in stool. Patient stated this is the same reaction he gets from aspirin. Please advise, thanks

## 2021-08-04 NOTE — Telephone Encounter (Signed)
Left vm to call back

## 2021-08-19 ENCOUNTER — Telehealth: Payer: Self-pay

## 2021-08-19 NOTE — Telephone Encounter (Signed)
Eliquis 

## 2021-08-22 ENCOUNTER — Other Ambulatory Visit: Payer: Self-pay | Admitting: Nurse Practitioner

## 2021-08-22 MED ORDER — APIXABAN 5 MG PO TABS: 5.0000 mg | ORAL_TABLET | Freq: Two times a day (BID) | ORAL | 0 refills | Status: DC

## 2021-08-24 ENCOUNTER — Other Ambulatory Visit: Payer: Self-pay

## 2021-08-24 MED ORDER — APIXABAN 5 MG PO TABS: 5.0000 mg | ORAL_TABLET | Freq: Two times a day (BID) | ORAL | 0 refills | Status: DC

## 2021-09-01 ENCOUNTER — Other Ambulatory Visit: Payer: Self-pay

## 2021-09-01 ENCOUNTER — Ambulatory Visit (INDEPENDENT_AMBULATORY_CARE_PROVIDER_SITE_OTHER): Payer: BC Managed Care – PPO | Admitting: Nurse Practitioner

## 2021-09-01 ENCOUNTER — Encounter: Payer: Self-pay | Admitting: Nurse Practitioner

## 2021-09-01 VITALS — BP 127/72 | HR 54 | Temp 97.5°F | Ht 68.0 in | Wt 232.0 lb

## 2021-09-01 DIAGNOSIS — E785 Hyperlipidemia, unspecified: Secondary | ICD-10-CM | POA: Diagnosis not present

## 2021-09-01 DIAGNOSIS — I10 Essential (primary) hypertension: Secondary | ICD-10-CM | POA: Diagnosis not present

## 2021-09-01 DIAGNOSIS — Z86718 Personal history of other venous thrombosis and embolism: Secondary | ICD-10-CM | POA: Diagnosis not present

## 2021-09-01 MED ORDER — APIXABAN 5 MG PO TABS: 5.0000 mg | ORAL_TABLET | Freq: Two times a day (BID) | ORAL | 5 refills | Status: DC

## 2021-09-01 NOTE — Progress Notes (Signed)
[facility] [place], [place]  [address] Phone:  [phone]   Fax:  [phone]    Established Patient Office Visit  Subjective:  Patient ID: Tommy Walters, male    DOB: [birth year]  Age: 51 y.o. MRN: [id number]  CC:  Chief Complaint  Patient presents with   Follow-up    Pt is here today for his 6 month follow up visit. Pt states that he had a blood clot a month ago in his left leg and was diagnosed with DVT.    HPI [name] presents for follow up. He  has a past medical history of DVT (deep venous thrombosis) ([place]), History of colon polyps (06/2019), and Shortness of breath.   He is in today for a follow-up.  He was concerned that he had a DVT.  He was evaluated per outpatient order.  The ultrasound indicated there was a DVT to the left lower extremity.  Patient was advised to go to the emergency department for further evaluation.  He was started on Eliquis.  He could currently continues on Eliquis 5 mg twice daily.  His desire is not to remain on the Eliquis longer than 6 months.  He did have an episode of rectal bleeding.  On 1222 when the patient called in to report the symptoms.  He was told that he could hold his medication and if he was interested in being evaluated for an IVC filter that this could be arranged.  This would alleviate his having to use the anticoagulant.  He reports that he is not interested at this time. He reports that he was unable to get his medications from the pharmacy for a few days.  He was told that it was not sent in.  EMR indicates Eliquis was sent in on August 24, 2021. Denies headache, dizziness, visual changes, shortness of breath, dyspnea on exertion, chest pain, nausea, vomiting or any edema.     Past Medical History:  Diagnosis Date   DVT (deep venous thrombosis) (HCC)    History of colon polyps 06/2019   Shortness of breath     Past Surgical History:  Procedure Laterality Date   NO PAST  SURGERIES      Family History  Problem Relation Age of Onset   Hypertension Mother    Diabetes Mother    Stroke Mother 72       died from    Colon cancer Neg Hx    Colon polyps Neg Hx    Esophageal cancer Neg Hx    Rectal cancer Neg Hx    Stomach cancer Neg Hx     Social History   Socioeconomic History   Marital status: Married    Spouse name: [name]   Number of children: 4   Years of education: MS   Highest education level: Not on file  Occupational History   Occupation: Mudlogger   Tobacco Use   Smoking status: Never   Smokeless tobacco: Never  Vaping Use   Vaping Use: Never used  Substance and Sexual Activity   Alcohol use: Not Currently    Alcohol/week: 1.0 standard drink    Types: 1 Glasses of wine per week    Comment: occ   Drug use: No   Sexual activity: Yes    Birth control/protection: None  Other Topics Concern   Not on file  Social History Narrative   Lives with wife and 3 daughters.   Son is  in school elsewhere.   Social Determinants of Health   Financial Resource Strain: Not on file  Food Insecurity: Not on file  Transportation Needs: Not on file  Physical Activity: Not on file  Stress: Not on file  Social Connections: Not on file  Intimate Partner Violence: Not on file    Outpatient Medications Prior to Visit  Medication Sig Dispense Refill   acetaminophen (TYLENOL) 500 MG tablet Take 2 tablets (1,000 mg total) by mouth every 6 (six) hours as needed for mild pain, fever or headache. 30 tablet 5   Cholecalciferol (VITAMIN D3 PO) Take 1 tablet by mouth daily.     Coenzyme Q10 (CO Q 10 PO) Take 1 tablet by mouth daily.     Cyanocobalamin (B-12 PO) Take 1 tablet by mouth daily.     Glucosamine Sulfate 1000 MG CAPS Take 1 capsule (1,000 mg total) by mouth 2 (two) times daily. 180 capsule 3   Multiple Vitamin (ONE-A-DAY MENS PO) Take by mouth.     simvastatin (ZOCOR) 40 MG tablet Take 1 tablet (40 mg total) by mouth at bedtime. 90 tablet 3    Boswellia-Glucosamine-Vit D (OSTEO BI-FLEX ONE PER DAY PO) Take by mouth. (Patient not taking: Reported on 07/22/2021)     famotidine (PEPCID) 20 MG tablet Take 1 tablet (20 mg total) by mouth 2 (two) times daily. (Patient not taking: Reported on 07/22/2021) 90 tablet 1   Melatonin-Pyridoxine (MELATIN PO) Take by mouth. (Patient not taking: Reported on 07/22/2021)     Na Sulfate-K Sulfate-Mg Sulf 17.5-3.13-1.6 GM/177ML SOLN Suprep (no substitutions)-TAKE AS DIRECTED. (Patient not taking: Reported on 07/22/2021) 354 mL 0   apixaban (ELIQUIS) 5 MG TABS tablet Take 1 tablet (5 mg total) by mouth 2 (two) times daily. (Patient not taking: Reported on 09/01/2021) 60 tablet 0   No facility-administered medications prior to visit.    Allergies  Allergen Reactions   Aspirin Other (See Comments)    Too much causes blood in stool.   Caffeine Other (See Comments)    GI upset   Cherry Nausea And Vomiting    ROS Review of Systems    Objective:    Physical Exam  BP 127/72    Pulse (!) 54    Temp (!) 97.5 F (36.4 C)    Ht _0  (1.727 m)    Wt 232 lb (105.2 kg)    SpO2 100%    BMI 35.28 kg/m  Wt Readings from Last 3 Encounters:  09/01/21 232 lb (105.2 kg)  07/15/21 235 lb 3.2 oz (106.7 kg)  09/24/20 225 lb (102.1 kg)     Health Maintenance Due  Topic Date Due   COVID-19 Vaccine (4 - Booster for J[place]sen series) 06/25/2021    There are no preventive care reminders to display for this patient.  Lab Results  Component Value Date   TSH 1.830 09/24/2020   Lab Results  Component Value Date   WBC 6.7 05/26/2021   HGB 14.5 05/26/2021   HCT 44.5 05/26/2021   MCV 83 05/26/2021   PLT 226 05/26/2021   Lab Results  Component Value Date   NA 140 05/26/2021   K 4.5 05/26/2021   CO2 19 (L) 09/24/2020   GLUCOSE 79 05/26/2021   BUN 12 05/26/2021   CREATININE 1.02 05/26/2021   BILITOT 0.5 05/26/2021   ALKPHOS 66 05/26/2021   AST 23 05/26/2021   ALT 38 09/24/2020   PROT 7.4 05/26/2021    ALBUMIN 4.4 05/26/2021   CALCIUM  9.3 05/26/2021   ANIONGAP 6 03/02/2016   EGFR 88 05/26/2021   Lab Results  Component Value Date   CHOL 162 05/26/2021   Lab Results  Component Value Date   HDL 42 05/26/2021   Lab Results  Component Value Date   LDLCALC 109 (H) 05/26/2021   Lab Results  Component Value Date   TRIG 55 05/26/2021   Lab Results  Component Value Date   CHOLHDL 3.9 05/26/2021   Lab Results  Component Value Date   HGBA1C 5.5 07/25/2019      Assessment & Plan:   Problem List Items Addressed This Visit       Cardiovascular and Mediastinum   Essential hypertension - Primary Stable Encouraged home monitoring and recording BP <130/80 Eating a heart-healthy diet with less salt Encouraged regular physical activity  Recommend Weight loss we will consider   Relevant Medications   apixaban (ELIQUIS) 5 MG TABS tablet   Other Relevant Orders   Comp. Metabolic Panel (12)     Other   History of DVT (deep vein thrombosis) Stable currently on Eliquis 5 mg twice daily Discussed with patient the importance of therapy.  Also instructed patient that if he uses therapy for 6 months and then discontinues therapy if there is a recurrence of a DVT the recommendation is for lifetime use.  He verbalized understanding but reports that he is too young for this.   Relevant Orders   CBC with Differential/Platelet   Hyperlipidemia   Relevant Medications   apixaban (ELIQUIS) 5 MG TABS tablet    Meds ordered this encounter  Medications   DISCONTD: apixaban (ELIQUIS) 5 MG TABS tablet    Sig: Take 1 tablet (5 mg total) by mouth 2 (two) times daily.    Dispense:  60 tablet    Refill:  5    Order Specific Question:   Supervising Provider    Answer:   Tresa Garter [[id number]]   apixaban (ELIQUIS) 5 MG TABS tablet    Sig: Take 1 tablet (5 mg total) by mouth 2 (two) times daily.    Dispense:  60 tablet    Refill:  5    Order Specific Question:   Supervising Provider     Answer:   Tresa Garter [id number]    Follow-up: Return in about 3 months (around 11/30/2021) for Follow up HTN [id number].    Vevelyn Francois, NP

## 2021-09-01 NOTE — Patient Instructions (Signed)
Deep Vein Thrombosis Deep vein thrombosis (DVT) is a condition in which a blood clot forms in a vein of the deep venous system. This can occur in the lower leg, thigh, pelvis, arm, or neck. A clot is blood that has thickened into a gel or solid. This condition is serious and can be life-threatening if the clot travels to the arteries of the lungs and causes a blockage (pulmonary embolism). A DVT can also damage veins in the leg, which can lead to long-term venous disease, leg pain, swelling, discoloration, and ulcers or sores (post-thrombotic syndrome). What are the causes? This condition may be caused by: A slowdown of blood flow. Damage to a vein. A condition that causes blood to clot more easily, such as certain bleeding disorders. What increases the risk? The following factors may make you more likely to develop this condition: Obesity. Being older, especially older than age 8. Being inactive or not moving around (sedentary lifestyle). This may include: Sitting or lying down for longer than 4-6 hours other than to sleep at night. Being in the hospital, or having major or lengthy surgery. Having any recent bone injuries, such as breaks (fractures), that reduce movement, especially in the lower extremities. Having recent orthopedic surgery on the lower extremities. Being pregnant, giving birth, or having recently given birth. Taking medicines that contain estrogen, such as birth control or hormone replacement therapy. Using products that contain nicotine or tobacco, especially if you use hormonal birth control. Having a history of a blood vessel disease (peripheral vascular disease) or congestive heart disease. Having a history of cancer, especially if being treated with chemotherapy. What are the signs or symptoms? Symptoms of this condition include: Swelling, pain, pressure, or tenderness in an arm or a leg. An arm or a leg becoming warm, red, or discolored. A leg turning very pale or  blue. You may have a large DVT. This is rare. If the clot is in your leg, you may notice that symptoms get worse when you stand or walk. In some cases, there are no symptoms. How is this diagnosed? This condition is diagnosed with: Your medical history and a physical exam. Tests, such as: Blood tests to check how well your blood clots. Doppler ultrasound. This is the best way to find a DVT. CT venogram. Contrast dye is injected into a vein, and X-rays are taken to check for clots. This is helpful for veins in the chest or pelvis. How is this treated? Treatment for this condition depends on: The cause of your DVT. The size and location of your DVT, or having more than one DVT. Your risk for bleeding or developing more clots. Other medical conditions you may have. Treatment may include: Taking a blood thinner medicine (anticoagulant) to prevent more clots from forming or current clots from growing. Wearing compression stockings. Injecting medicines into the affected vein to break up the clot (catheter-directed thrombolysis). Surgical procedures, when DVT is severe or hard to treat. These may be done to: Isolate and remove your clot. Place an inferior vena cava (IVC) filter. This filter is placed into a large vein called the inferior vena cava to catch blood clots before they reach your lungs. You may get some medical treatments for 6 months or longer. Follow these instructions at home: If you are taking blood thinners: Talk with your health care provider before you take any medicines that contain aspirin or NSAIDs, such as ibuprofen. These medicines increase your risk for dangerous bleeding. Take your medicine exactly as  told, at the same time every day. Do not skip a dose. Do not take more than the prescribed dose. This is important. Ask your health care provider about foods and medicines that could change or interact with the way your blood thinner works. Avoid these foods and medicines  if you are told to do so. Avoid anything that may cause bleeding or bruising. You may bleed more easily while taking blood thinners. Be very careful when using knives, scissors, or other sharp objects. Use an electric razor instead of a blade. Avoid activities that could cause injury or bruising, and follow instructions for preventing falls. Tell your health care provider if you have had any internal bleeding, bleeding ulcers, or neurologic diseases, such as strokes or cerebral aneurysms. Wear a medical alert bracelet or carry a card that lists what medicines you take. General instructions Take over-the-counter and prescription medicines only as told by your health care provider. Return to your normal activities as told by your health care provider. Ask your health care provider what activities are safe for you. If recommended, wear compression stockings as told by your health care provider. These stockings help to prevent blood clots and reduce swelling in your legs. Never wear your compression stockings while sleeping at night. Keep all follow-up visits. This is important. Where to find more information American Heart Association: www.heart.org Centers for Disease Control and Prevention: http://www.wolf.info/ National Heart, Lung, and Blood Institute: https://wilson-eaton.com/ Contact a health care provider if: You miss a dose of your blood thinner. You have unusual bruising or other color changes. You have new or worse pain, swelling, or redness in an arm or a leg. You have worsening numbness or tingling in an arm or a leg. You have a significant color change (pale or blue) in the extremity that has the DVT. Get help right away if: You have signs or symptoms that a blood clot has moved to the lungs. These may include: Shortness of breath. Chest pain. Fast or irregular heartbeats (palpitations). Light-headedness, dizziness, or fainting. Coughing up blood. You have signs or symptoms that your blood is  too thin. These may include: Blood in your vomit, stool, or urine. A cut that will not stop bleeding. A menstrual period that is heavier than usual. A severe headache or confusion. These symptoms may be an emergency. Get help right away. Call 911. Do not wait to see if the symptoms will go away. Do not drive yourself to the hospital. Summary Deep vein thrombosis (DVT) happens when a blood clot forms in a deep vein. This may occur in the lower leg, thigh, pelvis, arm, or neck. Symptoms affect the arm or leg and can include swelling, pain, tenderness, warmth, redness, or discoloration. This condition may be treated with medicines. In severe cases, a procedure or surgery may be done to remove or dissolve the clots. If you are taking blood thinners, take them exactly as told. Do not skip a dose. Do not take more than is prescribed. Get help right away if you have a severe headache, shortness of breath, chest pain, fast or irregular heartbeats, or blood in your vomit, urine, or stool. This information is not intended to replace advice given to you by your health care provider. Make sure you discuss any questions you have with your health care provider. Document Revised: 02/21/2021 Document Reviewed: 02/21/2021 Elsevier Patient Education  [place].

## 2021-09-02 ENCOUNTER — Encounter: Payer: Self-pay | Admitting: Nurse Practitioner

## 2021-09-02 ENCOUNTER — Other Ambulatory Visit: Payer: Self-pay | Admitting: Nurse Practitioner

## 2021-09-02 DIAGNOSIS — Z86718 Personal history of other venous thrombosis and embolism: Secondary | ICD-10-CM | POA: Diagnosis not present

## 2021-09-02 DIAGNOSIS — I10 Essential (primary) hypertension: Secondary | ICD-10-CM | POA: Diagnosis not present

## 2021-09-03 LAB — CBC WITH DIFFERENTIAL/PLATELET
Basophils Absolute: 0.1 10*3/uL (ref 0.0–0.2)
Basos: 1 %
EOS (ABSOLUTE): 0.3 10*3/uL (ref 0.0–0.4)
Eos: 5 %
Hematocrit: 47.8 % (ref 37.5–51.0)
Hemoglobin: 15.3 g/dL (ref 13.0–17.7)
Immature Grans (Abs): 0 10*3/uL (ref 0.0–0.1)
Immature Granulocytes: 0 %
Lymphocytes Absolute: 2.7 10*3/uL (ref 0.7–3.1)
Lymphs: 45 %
MCH: 27.3 pg (ref 26.6–33.0)
MCHC: 32 g/dL (ref 31.5–35.7)
MCV: 85 fL (ref 79–97)
Monocytes Absolute: 0.6 10*3/uL (ref 0.1–0.9)
Monocytes: 9 %
Neutrophils Absolute: 2.5 10*3/uL (ref 1.4–7.0)
Neutrophils: 40 %
Platelets: 233 10*3/uL (ref 150–450)
RBC: 5.61 x10E6/uL (ref 4.14–5.80)
RDW: 12.6 % (ref 11.6–15.4)
WBC: 6.1 10*3/uL (ref 3.4–10.8)

## 2021-09-03 LAB — COMP. METABOLIC PANEL (12)
AST: 27 IU/L (ref 0–40)
Albumin/Globulin Ratio: 1.8 (ref 1.2–2.2)
Albumin: 4.8 g/dL (ref 3.8–4.9)
Alkaline Phosphatase: 59 IU/L (ref 44–121)
BUN/Creatinine Ratio: 9 (ref 9–20)
BUN: 9 mg/dL (ref 6–24)
Bilirubin Total: 0.4 mg/dL (ref 0.0–1.2)
Calcium: 9.7 mg/dL (ref 8.7–10.2)
Chloride: 101 mmol/L (ref 96–106)
Creatinine, Ser: 0.95 mg/dL (ref 0.76–1.27)
Globulin, Total: 2.7 g/dL (ref 1.5–4.5)
Glucose: 89 mg/dL (ref 70–99)
Potassium: 4.4 mmol/L (ref 3.5–5.2)
Sodium: 141 mmol/L (ref 134–144)
Total Protein: 7.5 g/dL (ref 6.0–8.5)
eGFR: 96 mL/min/{1.73_m2} (ref >59)

## 2021-09-05 ENCOUNTER — Telehealth: Payer: Self-pay

## 2021-09-05 NOTE — Telephone Encounter (Signed)
Pt called and asked if he can get a letter to clear him from blood clot for his [employer] license

## 2021-09-09 ENCOUNTER — Encounter: Payer: Self-pay | Admitting: Nurse Practitioner

## 2021-12-14 ENCOUNTER — Ambulatory Visit (HOSPITAL_COMMUNITY)
Admission: RE | Admit: 2021-12-14 | Discharge: 2021-12-14 | Disposition: A | Discharge status: Home / Self Care | Payer: BC Managed Care – PPO | Source: Ambulatory Visit | Attending: Nurse Practitioner | Admitting: Nurse Practitioner

## 2021-12-14 ENCOUNTER — Ambulatory Visit (INDEPENDENT_AMBULATORY_CARE_PROVIDER_SITE_OTHER): Payer: BC Managed Care – PPO | Admitting: Nurse Practitioner

## 2021-12-14 VITALS — BP 149/83 | HR 72 | Resp 16

## 2021-12-14 DIAGNOSIS — R200 Anesthesia of skin: Secondary | ICD-10-CM | POA: Diagnosis not present

## 2021-12-14 DIAGNOSIS — M79642 Pain in left hand: Secondary | ICD-10-CM

## 2021-12-14 MED ORDER — PREDNISONE 20 MG PO TABS: 20.0000 mg | ORAL_TABLET | Freq: Every day | ORAL | 0 refills | Status: AC

## 2021-12-14 NOTE — Progress Notes (Signed)
$'@Patient'e$  ID: Tommy Walters, male    DOB: 1970-09-29, 51 y.o.   MRN: [id number] ? ?Chief Complaint  ?Patient presents with  ? Hand Pain  ? left hand pain  ? ? ?Referring provider: ?Vevelyn Francois, NP ? ?HPI ? ?Patient presents today for left hand pain.  He states that this has been an issue for the past 3 weeks.  Patient does have full range of motion to his hand.  He states the pain is located along the base of his thumb and into the palm of his hand.  He has experienced some numbness to his fingers as well.  We discussed that we will check an x-ray today. Denies f/c/s, n/v/d, hemoptysis, PND, chest pain or edema. ? ? ? ? ? ?Allergies  ?Allergen Reactions  ? Aspirin Other (See Comments)  ?  Too much causes blood in stool.  ? Caffeine Other (See Comments)  ?  GI upset  ? Cherry Nausea And Vomiting  ? ? ?Immunization History  ?Administered Date(s) Administered  ? Influenza Split 07/05/2012  ? Influenza, Seasonal, Injecte, Preservative Fre 06/28/2014  ? Influenza,inj,Quad PF,6+ Mos 06/14/2016, 05/18/2017, 06/21/2018, 05/14/2019, 05/04/2020, 05/07/2021  ? Janssen (J&J) SARS-COV-2 Vaccination 11/04/2019  ? PFIZER(Purple Top)SARS-COV-2 Vaccination 07/03/2020, 04/30/2021  ? Tdap 08/03/2005, 07/05/2012, 07/17/2016  ? ? ?Past Medical History:  ?Diagnosis Date  ? DVT (deep venous thrombosis) ([place])   ? History of colon polyps 06/2019  ? Shortness of breath   ? ? ?Tobacco History: ?Social History  ? ?Tobacco Use  ?Smoking Status Never  ?Smokeless Tobacco Never  ? ?Counseling given: Not Answered ? ? ?Outpatient Encounter Medications as of 12/14/2021  ?Medication Sig  ? predniSONE (DELTASONE) 20 MG tablet Take 1 tablet (20 mg total) by mouth daily with breakfast for 5 days.  ? acetaminophen (TYLENOL) 500 MG tablet Take 2 tablets (1,000 mg total) by mouth every 6 (six) hours as needed for mild pain, fever or headache.  ? apixaban (ELIQUIS) 5 MG TABS tablet Take 1 tablet (5 mg total) by mouth 2 (two) times daily.  ?  Boswellia-Glucosamine-Vit D (OSTEO BI-FLEX ONE PER DAY PO) Take by mouth. (Patient not taking: Reported on 07/22/2021)  ? Cholecalciferol (VITAMIN D3 PO) Take 1 tablet by mouth daily.  ? Coenzyme Q10 (CO Q 10 PO) Take 1 tablet by mouth daily.  ? Cyanocobalamin (B-12 PO) Take 1 tablet by mouth daily.  ? famotidine (PEPCID) 20 MG tablet Take 1 tablet (20 mg total) by mouth 2 (two) times daily. (Patient not taking: Reported on 07/22/2021)  ? Glucosamine Sulfate 1000 MG CAPS Take 1 capsule (1,000 mg total) by mouth 2 (two) times daily.  ? Melatonin-Pyridoxine (MELATIN PO) Take by mouth. (Patient not taking: Reported on 07/22/2021)  ? Multiple Vitamin (ONE-A-DAY MENS PO) Take by mouth.  ? Na Sulfate-K Sulfate-Mg Sulf 17.5-3.13-1.6 GM/177ML SOLN Suprep (no substitutions)-TAKE AS DIRECTED. (Patient not taking: Reported on 07/22/2021)  ? simvastatin (ZOCOR) 40 MG tablet Take 1 tablet (40 mg total) by mouth at bedtime.  ? ?No facility-administered encounter medications on file as of 12/14/2021.  ? ? ? ?Review of Systems ? ?Review of Systems  ?Constitutional: Negative.   ?HENT: Negative.    ?Cardiovascular: Negative.   ?Gastrointestinal: Negative.   ?Musculoskeletal:   ?     Left hand pain  ?Allergic/Immunologic: Negative.   ?Neurological: Negative.   ?Psychiatric/Behavioral: Negative.     ? ? ? ?Physical Exam ? ?BP (!) 149/83   Pulse 72   Resp 16  ? ?  Wt Readings from Last 5 Encounters:  ?09/01/21 232 lb (105.2 kg)  ?07/15/21 235 lb 3.2 oz (106.7 kg)  ?09/24/20 225 lb (102.1 kg)  ?01/23/20 227 lb (103 kg)  ?07/25/19 224 lb 6.4 oz (101.8 kg)  ? ? ? ?Physical Exam ?Vitals and nursing note reviewed.  ?Constitutional:   ?   General: He is not in acute distress. ?   Appearance: He is well-developed.  ?Cardiovascular:  ?   Rate and Rhythm: Normal rate and regular rhythm.  ?Pulmonary:  ?   Effort: Pulmonary effort is normal.  ?   Breath sounds: Normal breath sounds.  ?Musculoskeletal:  ?   Right hand: No swelling or tenderness. Normal  range of motion.  ?   Left hand: Tenderness present. No swelling or bony tenderness. Normal range of motion. Normal strength. Normal pulse.  ?Skin: ?   General: Skin is warm and dry.  ?Neurological:  ?   Mental Status: He is alert and oriented to person, place, and time.  ? ? ? ?Lab Results: ? ?CBC ?   ?Component Value Date/Time  ? WBC 6.1 09/02/2021 0820  ? WBC 6.8 01/15/2017 0917  ? RBC 5.61 09/02/2021 0820  ? RBC 5.15 01/15/2017 0917  ? HGB 15.3 09/02/2021 0820  ? HGB 14.1 07/12/2010 1137  ? HCT 47.8 09/02/2021 0820  ? HCT 42.6 07/12/2010 1137  ? PLT 233 09/02/2021 0820  ? MCV 85 09/02/2021 0820  ? MCV 80.1 07/12/2010 1137  ? MCH 27.3 09/02/2021 0820  ? MCH 26.6 (L) 01/15/2017 4008  ? MCHC 32.0 09/02/2021 0820  ? MCHC 31.9 (L) 01/15/2017 0917  ? RDW 12.6 09/02/2021 0820  ? RDW 13.6 07/12/2010 1137  ? LYMPHSABS 2.7 09/02/2021 0820  ? LYMPHSABS 2.5 07/12/2010 1137  ? MONOABS 544 01/15/2017 0917  ? MONOABS 0.4 07/12/2010 1137  ? EOSABS 0.3 09/02/2021 0820  ? BASOSABS 0.1 09/02/2021 0820  ? BASOSABS 0.0 07/12/2010 1137  ? ? ?BMET ?   ?Component Value Date/Time  ? NA 141 09/02/2021 0820  ? K 4.4 09/02/2021 0820  ? CL 101 09/02/2021 0820  ? CO2 19 (L) 09/24/2020 0919  ? GLUCOSE 89 09/02/2021 0820  ? GLUCOSE 83 01/15/2017 0917  ? BUN 9 09/02/2021 0820  ? CREATININE 0.95 09/02/2021 0820  ? CREATININE 1.16 01/15/2017 0917  ? CALCIUM 9.7 09/02/2021 0820  ? GFRNONAA 68 09/24/2020 0919  ? GFRNONAA 74 01/15/2017 0917  ? GFRAA 79 09/24/2020 0919  ? GFRAA 86 01/15/2017 0917  ? ? ?BNP ?No results found for: BNP ? ?ProBNP ?No results found for: PROBNP ? ?Imaging: ?DG Hand Complete Left ? ?Result Date: 12/15/2021 ?CLINICAL DATA:  Left hand pain and numbness. EXAM: LEFT HAND - COMPLETE 3+ VIEW COMPARISON:  No prior. FINDINGS: Small corticated bony density noted adjacent to the trapezium consistent with old fracture fragment. No acute bony or joint abnormality identified. No significant arthropathy. Soft tissues are unremarkable.  IMPRESSION: Small corticated bony density noted adjacent to the trapezium consistent with old fracture fragment. No acute bony or joint abnormality identified. Electronically Signed   By: Marcello Moores  Register M.D.   On: 12/15/2021 08:46   ? ? ?Assessment & Plan:  ? ?No problem-specific Assessment & Plan notes found for this encounter. ? ? ? ? ?Fenton Foy, NP ?12/16/2021 ? ?

## 2021-12-14 NOTE — Patient Instructions (Addendum)
1. Left hand pain ? ?- DG Hand Complete Left ?- predniSONE (DELTASONE) 20 MG tablet; Take 1 tablet (20 mg total) by mouth daily with breakfast for 5 days.  Dispense: 5 tablet; Refill: 0 ? ?Follow up: ?Follow up if needed ? ? ? ?

## 2021-12-16 ENCOUNTER — Encounter: Payer: Self-pay | Admitting: Nurse Practitioner

## 2021-12-16 DIAGNOSIS — M79642 Pain in left hand: Secondary | ICD-10-CM | POA: Insufficient documentation

## 2021-12-16 NOTE — Assessment & Plan Note (Signed)
-   DG Hand Complete Left ?- predniSONE (DELTASONE) 20 MG tablet; Take 1 tablet (20 mg total) by mouth daily with breakfast for 5 days.  Dispense: 5 tablet; Refill: 0 ? ?Follow up: ?Follow up if needed ?

## 2022-01-13 ENCOUNTER — Ambulatory Visit: Payer: BC Managed Care – PPO | Admitting: Nurse Practitioner

## 2022-02-23 ENCOUNTER — Other Ambulatory Visit: Payer: Self-pay | Admitting: Nurse Practitioner

## 2022-03-02 ENCOUNTER — Ambulatory Visit: Payer: BC Managed Care – PPO | Admitting: Nurse Practitioner

## 2022-03-02 ENCOUNTER — Telehealth: Payer: Self-pay | Admitting: Nurse Practitioner

## 2022-03-02 ENCOUNTER — Encounter: Payer: Self-pay | Admitting: Nurse Practitioner

## 2022-03-02 ENCOUNTER — Ambulatory Visit (INDEPENDENT_AMBULATORY_CARE_PROVIDER_SITE_OTHER): Payer: BC Managed Care – PPO | Admitting: Nurse Practitioner

## 2022-03-02 VITALS — BP 137/92 | HR 68 | Temp 98.0°F | Ht 68.0 in | Wt 222.2 lb

## 2022-03-02 DIAGNOSIS — Z0000 Encounter for general adult medical examination without abnormal findings: Secondary | ICD-10-CM | POA: Diagnosis not present

## 2022-03-02 DIAGNOSIS — M79642 Pain in left hand: Secondary | ICD-10-CM | POA: Diagnosis not present

## 2022-03-02 DIAGNOSIS — I824Z9 Acute embolism and thrombosis of unspecified deep veins of unspecified distal lower extremity: Secondary | ICD-10-CM

## 2022-03-02 DIAGNOSIS — Z1211 Encounter for screening for malignant neoplasm of colon: Secondary | ICD-10-CM

## 2022-03-02 LAB — POCT URINALYSIS DIP (CLINITEK)
Bilirubin, UA: NEGATIVE
Glucose, UA: NEGATIVE mg/dL
Ketones, POC UA: NEGATIVE mg/dL
Leukocytes, UA: NEGATIVE
Nitrite, UA: NEGATIVE
POC PROTEIN,UA: NEGATIVE
Spec Grav, UA: 1.01 (ref 1.010–1.025)
Urobilinogen, UA: 0.2 E.U./dL
pH, UA: 6 (ref 5.0–8.0)

## 2022-03-02 MED ORDER — RIVAROXABAN 20 MG PO TABS: 20.0000 mg | ORAL_TABLET | Freq: Every day | ORAL | 2 refills | Status: DC

## 2022-03-02 MED ORDER — PREDNISONE 20 MG PO TABS: 20.0000 mg | ORAL_TABLET | Freq: Every day | ORAL | 0 refills | Status: AC

## 2022-03-02 NOTE — Telephone Encounter (Signed)
Please call to let patient know that we have switched him to xarelto. Also I am placing a referral for hematology evaluation for multiple DVT's. Thanks

## 2022-03-02 NOTE — Progress Notes (Signed)
$'@Patient'b$  ID: Tommy Walters, male    DOB: 1970-11-01, 51 y.o.   MRN: [id number]  Chief Complaint  Patient presents with   Follow-up    Pt is here for physical.    Referring provider: Vevelyn Francois, NP   HPI  Tommy Walters presents for follow up. He  has a past medical history of DVT (deep venous thrombosis) ([place]), History of colon polyps (06/2019), and Shortness of breath.   Hand is much improved - short course of prednisone - will repeat today.       Allergies  Allergen Reactions   Aspirin Other (See Comments)    Too much causes blood in stool.   Caffeine Other (See Comments)    GI upset   Cherry Nausea And Vomiting    Immunization History  Administered Date(s) Administered   Influenza Split 07/05/2012   Influenza, Seasonal, Injecte, Preservative Fre 06/28/2014   Influenza,inj,Quad PF,6+ Mos 06/14/2016, 05/18/2017, 06/21/2018, 05/14/2019, 05/04/2020, 05/07/2021   Janssen (J&J) SARS-COV-2 Vaccination 11/04/2019   PFIZER(Purple Top)SARS-COV-2 Vaccination 07/03/2020, 04/30/2021   Tdap 08/03/2005, 07/05/2012, 07/17/2016    Past Medical History:  Diagnosis Date   DVT (deep venous thrombosis) ([facility])    History of colon polyps 06/2019   Shortness of breath     Tobacco History: Social History   Tobacco Use  Smoking Status Never  Smokeless Tobacco Never   Counseling given: Not Answered   Outpatient Encounter Medications as of 03/02/2022  Medication Sig   acetaminophen (TYLENOL) 500 MG tablet Take 2 tablets (1,000 mg total) by mouth every 6 (six) hours as needed for mild pain, fever or headache.   Cholecalciferol (VITAMIN D3 PO) Take 1 tablet by mouth daily.   Cyanocobalamin (B-12 PO) Take 1 tablet by mouth daily.   ELIQUIS 5 MG TABS tablet Take 1 tablet by mouth twice daily   Glucosamine Sulfate 1000 MG CAPS Take 1 capsule (1,000 mg total) by mouth 2 (two) times daily.   Melatonin-Pyridoxine (MELATIN PO) Take by mouth.   Multiple Vitamin (ONE-A-DAY MENS  PO) Take by mouth.   simvastatin (ZOCOR) 40 MG tablet Take 1 tablet (40 mg total) by mouth at bedtime.   Boswellia-Glucosamine-Vit D (OSTEO BI-FLEX ONE PER DAY PO) Take by mouth. (Patient not taking: Reported on 07/22/2021)   Coenzyme Q10 (CO Q 10 PO) Take 1 tablet by mouth daily. (Patient not taking: Reported on 03/02/2022)   famotidine (PEPCID) 20 MG tablet Take 1 tablet (20 mg total) by mouth 2 (two) times daily. (Patient not taking: Reported on 07/22/2021)   Na Sulfate-K Sulfate-Mg Sulf 17.5-3.13-1.6 GM/177ML SOLN Suprep (no substitutions)-TAKE AS DIRECTED. (Patient not taking: Reported on 07/22/2021)   No facility-administered encounter medications on file as of 03/02/2022.     Review of Systems  Review of Systems     Physical Exam  BP (!) 143/82 (BP Location: Left Arm, Patient Position: Sitting, Cuff Size: Large)   Pulse 67   Temp 98 F (36.7 C)   Ht '5\' 8"'$  (1.727 m)   Wt 222 lb 4 oz (100.8 kg)   SpO2 100%   BMI 33.79 kg/m   Wt Readings from Last 5 Encounters:  03/02/22 222 lb 4 oz (100.8 kg)  09/01/21 232 lb (105.2 kg)  07/15/21 235 lb 3.2 oz (106.7 kg)  09/24/20 225 lb (102.1 kg)  01/23/20 227 lb (103 kg)     Physical Exam   Lab Results:  CBC    Component Value Date/Time   WBC 6.1 09/02/2021 0820  WBC 6.8 01/15/2017 0917   RBC 5.61 09/02/2021 0820   RBC 5.15 01/15/2017 0917   HGB 15.3 09/02/2021 0820   HGB 14.1 07/12/2010 1137   HCT 47.8 09/02/2021 0820   HCT 42.6 07/12/2010 1137   PLT 233 09/02/2021 0820   MCV 85 09/02/2021 0820   MCV 80.1 07/12/2010 1137   MCH 27.3 09/02/2021 0820   MCH 26.6 (L) 01/15/2017 0917   MCHC 32.0 09/02/2021 0820   MCHC 31.9 (L) 01/15/2017 0917   RDW 12.6 09/02/2021 0820   RDW 13.6 07/12/2010 1137   LYMPHSABS 2.7 09/02/2021 0820   LYMPHSABS 2.5 07/12/2010 1137   MONOABS 544 01/15/2017 0917   MONOABS 0.4 07/12/2010 1137   EOSABS 0.3 09/02/2021 0820   BASOSABS 0.1 09/02/2021 0820   BASOSABS 0.0 07/12/2010 1137     BMET    Component Value Date/Time   NA 141 09/02/2021 0820   K 4.4 09/02/2021 0820   CL 101 09/02/2021 0820   CO2 19 (L) 09/24/2020 0919   GLUCOSE 89 09/02/2021 0820   GLUCOSE 83 01/15/2017 0917   BUN 9 09/02/2021 0820   CREATININE 0.95 09/02/2021 0820   CREATININE 1.16 01/15/2017 0917   CALCIUM 9.7 09/02/2021 0820   GFRNONAA 68 09/24/2020 0919   GFRNONAA 74 01/15/2017 0917   GFRAA 79 09/24/2020 0919   GFRAA 86 01/15/2017 0917    BNP No results found for: "BNP"  ProBNP No results found for: "PROBNP"  Imaging: No results found.   Assessment & Plan:   No problem-specific Assessment & Plan notes found for this encounter.     Fenton Foy, NP 03/02/2022

## 2022-03-02 NOTE — Patient Instructions (Addendum)
1. Annual physical exam  - POCT URINALYSIS DIP (CLINITEK)  - will check labs  2. Left hand pain  - predniSONE (DELTASONE) 20 MG tablet; Take 1 tablet (20 mg total) by mouth daily with breakfast for 5 days.  Dispense: 5 tablet; Refill: 0  Follow up:  Follow up in 6 months   Calorie Counting for Weight Loss Calories are units of energy. Your body needs a certain number of calories from food to keep going throughout the day. When you eat or drink more calories than your body needs, your body stores the extra calories mostly as fat. When you eat or drink fewer calories than your body needs, your body burns fat to get the energy it needs. Calorie counting means keeping track of how many calories you eat and drink each day. Calorie counting can be helpful if you need to lose weight. If you eat fewer calories than your body needs, you should lose weight. Ask your health care provider what a healthy weight is for you. For calorie counting to work, you will need to eat the right number of calories each day to lose a healthy amount of weight per week. A dietitian can help you figure out how many calories you need in a day and will suggest ways to reach your calorie goal. A healthy amount of weight to lose each week is usually 1-2 lb (0.5-0.9 kg). This usually means that your daily calorie intake should be reduced by 500-750 calories. Eating 1,200-1,500 calories a day can help most women lose weight. Eating 1,500-1,800 calories a day can help most men lose weight. What do I need to know about calorie counting? Work with your health care provider or dietitian to determine how many calories you should get each day. To meet your daily calorie goal, you will need to: Find out how many calories are in each food that you would like to eat. Try to do this before you eat. Decide how much of the food you plan to eat. Keep a food log. Do this by writing down what you ate and how many calories it had. To  successfully lose weight, it is important to balance calorie counting with a healthy lifestyle that includes regular activity. Where do I find calorie information?  The number of calories in a food can be found on a Nutrition Facts label. If a food does not have a Nutrition Facts label, try to look up the calories online or ask your dietitian for help. Remember that calories are listed per serving. If you choose to have more than one serving of a food, you will have to multiply the calories per serving by the number of servings you plan to eat. For example, the label on a package of bread might say that a serving size is 1 slice and that there are 90 calories in a serving. If you eat 1 slice, you will have eaten 90 calories. If you eat 2 slices, you will have eaten 180 calories. How do I keep a food log? After each time that you eat, record the following in your food log as soon as possible: What you ate. Be sure to include toppings, sauces, and other extras on the food. How much you ate. This can be measured in cups, ounces, or number of items. How many calories were in each food and drink. The total number of calories in the food you ate. Keep your food log near you, such as in a pocket-sized notebook  or on an app or website on your mobile phone. Some programs will calculate calories for you and show you how many calories you have left to meet your daily goal. What are some portion-control tips? Know how many calories are in a serving. This will help you know how many servings you can have of a certain food. Use a measuring cup to measure serving sizes. You could also try weighing out portions on a kitchen scale. With time, you will be able to estimate serving sizes for some foods. Take time to put servings of different foods on your favorite plates or in your favorite bowls and cups so you know what a serving looks like. Try not to eat straight from a food's packaging, such as from a bag or box.  Eating straight from the package makes it hard to see how much you are eating and can lead to overeating. Put the amount you would like to eat in a cup or on a plate to make sure you are eating the right portion. Use smaller plates, glasses, and bowls for smaller portions and to prevent overeating. Try not to multitask. For example, avoid watching TV or using your computer while eating. If it is time to eat, sit down at a table and enjoy your food. This will help you recognize when you are full. It will also help you be more mindful of what and how much you are eating. What are tips for following this plan? Reading food labels Check the calorie count compared with the serving size. The serving size may be smaller than what you are used to eating. Check the source of the calories. Try to choose foods that are high in protein, fiber, and vitamins, and low in saturated fat, trans fat, and sodium. Shopping Read nutrition labels while you shop. This will help you make healthy decisions about which foods to buy. Pay attention to nutrition labels for low-fat or fat-free foods. These foods sometimes have the same number of calories or more calories than the full-fat versions. They also often have added sugar, starch, or salt to make up for flavor that was removed with the fat. Make a grocery list of lower-calorie foods and stick to it. Cooking Try to cook your favorite foods in a healthier way. For example, try baking instead of frying. Use low-fat dairy products. Meal planning Use more fruits and vegetables. One-half of your plate should be fruits and vegetables. Include lean proteins, such as chicken, [place], and fish. Lifestyle Each week, aim to do one of the following: 150 minutes of moderate exercise, such as walking. 75 minutes of vigorous exercise, such as running. General information Know how many calories are in the foods you eat most often. This will help you calculate calorie counts  faster. Find a way of tracking calories that works for you. Get creative. Try different apps or programs if writing down calories does not work for you. What foods should I eat?  Eat nutritious foods. It is better to have a nutritious, high-calorie food, such as an avocado, than a food with few nutrients, such as a bag of potato chips. Use your calories on foods and drinks that will fill you up and will not leave you hungry soon after eating. Examples of foods that fill you up are nuts and nut butters, vegetables, lean proteins, and high-fiber foods such as whole grains. High-fiber foods are foods with more than 5 g of fiber per serving. Pay attention to calories in  drinks. Low-calorie drinks include water and unsweetened drinks. The items listed above may not be a complete list of foods and beverages you can eat. Contact a dietitian for more information. What foods should I limit? Limit foods or drinks that are not good sources of vitamins, minerals, or protein or that are high in unhealthy fats. These include: Candy. Other sweets. Sodas, specialty coffee drinks, alcohol, and juice. The items listed above may not be a complete list of foods and beverages you should avoid. Contact a dietitian for more information. How do I count calories when eating out? Pay attention to portions. Often, portions are much larger when eating out. Try these tips to keep portions smaller: Consider sharing a meal instead of getting your own. If you get your own meal, eat only half of it. Before you start eating, ask for a container and put half of your meal into it. When available, consider ordering smaller portions from the menu instead of full portions. Pay attention to your food and drink choices. Knowing the way food is cooked and what is included with the meal can help you eat fewer calories. If calories are listed on the menu, choose the lower-calorie options. Choose dishes that include vegetables, fruits,  whole grains, low-fat dairy products, and lean proteins. Choose items that are boiled, broiled, grilled, or steamed. Avoid items that are buttered, battered, fried, or served with cream sauce. Items labeled as crispy are usually fried, unless stated otherwise. Choose water, low-fat milk, unsweetened iced tea, or other drinks without added sugar. If you want an alcoholic beverage, choose a lower-calorie option, such as a glass of wine or light beer. Ask for dressings, sauces, and syrups on the side. These are usually high in calories, so you should limit the amount you eat. If you want a salad, choose a garden salad and ask for grilled meats. Avoid extra toppings such as bacon, cheese, or fried items. Ask for the dressing on the side, or ask for olive oil and vinegar or lemon to use as dressing. Estimate how many servings of a food you are given. Knowing serving sizes will help you be aware of how much food you are eating at restaurants. Where to find more information Centers for Disease Control and Prevention: http://www.wolf.info/ [place] Department of Agriculture: http://www.wilson-mendoza.org/ Summary Calorie counting means keeping track of how many calories you eat and drink each day. If you eat fewer calories than your body needs, you should lose weight. A healthy amount of weight to lose per week is usually 1-2 lb (0.5-0.9 kg). This usually means reducing your daily calorie intake by 500-750 calories. The number of calories in a food can be found on a Nutrition Facts label. If a food does not have a Nutrition Facts label, try to look up the calories online or ask your dietitian for help. Use smaller plates, glasses, and bowls for smaller portions and to prevent overeating. Use your calories on foods and drinks that will fill you up and not leave you hungry shortly after a meal. This information is not intended to replace advice given to you by your health care provider. Make sure you discuss any questions you have with your  health care provider. Document Revised: 09/11/2019 Document Reviewed: 09/11/2019 [employer] Patient Education  [place].

## 2022-03-02 NOTE — Progress Notes (Signed)
$'@Patient'y$  ID: Tommy Walters, male    DOB: 07-25-1971, 51 y.o.   MRN: [id number]  Chief Complaint  Patient presents with   Follow-up    Pt is here for physical.    Referring provider: Vevelyn Francois, NP   HPI  Tommy Walters presents for follow up. He  has a past medical history of DVT (deep venous thrombosis) ([facility]), History of colon polyps (06/2019), and Shortness of breath.   Patient presents today for a physical.  At his last visit here he was seen for hand pain.  Hand is much improved - short course of prednisone - will repeat today.  Patient is due for lab work today. Denies f/c/s, n/v/d, hemoptysis, PND, leg swelling Denies chest pain or edema       Allergies  Allergen Reactions   Aspirin Other (See Comments)    Too much causes blood in stool.   Caffeine Other (See Comments)    GI upset   Cherry Nausea And Vomiting    Immunization History  Administered Date(s) Administered   Influenza Split 07/05/2012   Influenza, Seasonal, Injecte, Preservative Fre 06/28/2014   Influenza,inj,Quad PF,6+ Mos 06/14/2016, 05/18/2017, 06/21/2018, 05/14/2019, 05/04/2020, 05/07/2021, 04/18/2022   Janssen (J&J) SARS-COV-2 Vaccination 11/04/2019   PFIZER(Purple Top)SARS-COV-2 Vaccination 07/03/2020, 04/30/2021   Tdap 08/03/2005, 07/05/2012, 07/17/2016    Past Medical History:  Diagnosis Date   DVT (deep venous thrombosis) ([facility])    History of colon polyps 06/2019   Shortness of breath     Tobacco History: Social History   Tobacco Use  Smoking Status Never  Smokeless Tobacco Never   Counseling given: Not Answered   Outpatient Encounter Medications as of 03/02/2022  Medication Sig   acetaminophen (TYLENOL) 500 MG tablet Take 2 tablets (1,000 mg total) by mouth every 6 (six) hours as needed for mild pain, fever or headache.   Cholecalciferol (VITAMIN D3 PO) Take 1 tablet by mouth daily.   Cyanocobalamin (B-12 PO) Take 1 tablet by mouth daily.   Glucosamine Sulfate 1000 MG  CAPS Take 1 capsule (1,000 mg total) by mouth 2 (two) times daily.   Melatonin-Pyridoxine (MELATIN PO) Take by mouth.   Multiple Vitamin (ONE-A-DAY MENS PO) Take by mouth.   [EXPIRED] predniSONE (DELTASONE) 20 MG tablet Take 1 tablet (20 mg total) by mouth daily with breakfast for 5 days.   rivaroxaban (XARELTO) 20 MG TABS tablet Take 1 tablet (20 mg total) by mouth daily with supper.   simvastatin (ZOCOR) 40 MG tablet Take 1 tablet (40 mg total) by mouth at bedtime.   [DISCONTINUED] ELIQUIS 5 MG TABS tablet Take 1 tablet by mouth twice daily   Na Sulfate-K Sulfate-Mg Sulf 17.5-3.13-1.6 GM/177ML SOLN Suprep (no substitutions)-TAKE AS DIRECTED.   [DISCONTINUED] [place]-Glucosamine-Vit D (OSTEO BI-FLEX ONE PER DAY PO) Take by mouth. (Patient not taking: Reported on 07/22/2021)   [DISCONTINUED] Coenzyme Q10 (CO Q 10 PO) Take 1 tablet by mouth daily. (Patient not taking: Reported on 03/02/2022)   [DISCONTINUED] famotidine (PEPCID) 20 MG tablet Take 1 tablet (20 mg total) by mouth 2 (two) times daily. (Patient taking differently: Take 20 mg by mouth 2 (two) times daily. As needed)   No facility-administered encounter medications on file as of 03/02/2022.     Review of Systems  Review of Systems  Constitutional: Negative.   HENT: Negative.    Cardiovascular: Negative.   Gastrointestinal: Negative.   Allergic/Immunologic: Negative.   Neurological: Negative.   Psychiatric/Behavioral: Negative.  Physical Exam  BP (!) 137/92 (BP Location: Left Arm, Patient Position: Sitting, Cuff Size: Large)   Pulse 68   Temp 98 F (36.7 C)   Ht '5\' 8"'$  (1.727 m)   Wt 222 lb 4 oz (100.8 kg)   SpO2 100%   BMI 33.79 kg/m   Wt Readings from Last 5 Encounters:  03/31/22 223 lb 12.8 oz (101.5 kg)  03/02/22 222 lb 4 oz (100.8 kg)  09/01/21 232 lb (105.2 kg)  07/15/21 235 lb 3.2 oz (106.7 kg)  09/24/20 225 lb (102.1 kg)     Physical Exam Vitals and nursing note reviewed.  Constitutional:       General: He is not in acute distress.    Appearance: He is well-developed.  Cardiovascular:     Rate and Rhythm: Normal rate and regular rhythm.  Pulmonary:     Effort: Pulmonary effort is normal.     Breath sounds: Normal breath sounds.  Skin:    General: Skin is warm and dry.  Neurological:     Mental Status: He is alert and oriented to person, place, and time.      Lab Results:  CBC    Component Value Date/Time   WBC 6.0 03/31/2022 1510   WBC 6.8 01/15/2017 0917   RBC 4.99 03/31/2022 1510   HGB 13.6 03/31/2022 1510   HGB 14.4 03/02/2022 1518   HGB 14.1 07/12/2010 1137   HCT 41.5 03/31/2022 1510   HCT 44.3 03/02/2022 1518   HCT 42.6 07/12/2010 1137   PLT 196 03/31/2022 1510   PLT 241 03/02/2022 1518   MCV 83.2 03/31/2022 1510   MCV 83 03/02/2022 1518   MCV 80.1 07/12/2010 1137   MCH 27.3 03/31/2022 1510   MCHC 32.8 03/31/2022 1510   RDW 13.9 03/31/2022 1510   RDW 12.5 03/02/2022 1518   RDW 13.6 07/12/2010 1137   LYMPHSABS 1.8 03/31/2022 1510   LYMPHSABS 2.7 09/02/2021 0820   LYMPHSABS 2.5 07/12/2010 1137   MONOABS 0.9 03/31/2022 1510   MONOABS 0.4 07/12/2010 1137   EOSABS 0.4 03/31/2022 1510   EOSABS 0.3 09/02/2021 0820   BASOSABS 0.0 03/31/2022 1510   BASOSABS 0.1 09/02/2021 0820   BASOSABS 0.0 07/12/2010 1137    BMET    Component Value Date/Time   NA 138 03/31/2022 1510   NA 144 03/02/2022 1518   K 3.8 03/31/2022 1510   CL 109 03/31/2022 1510   CO2 27 03/31/2022 1510   GLUCOSE 84 03/31/2022 1510   BUN 13 03/31/2022 1510   BUN 10 03/02/2022 1518   CREATININE 1.32 (H) 03/31/2022 1510   CREATININE 1.16 01/15/2017 0917   CALCIUM 8.9 03/31/2022 1510   GFRNONAA >60 03/31/2022 1510   GFRNONAA 74 01/15/2017 0917   GFRAA 79 09/24/2020 0919   GFRAA 86 01/15/2017 0917    BNP No results found for: "BNP"  ProBNP No results found for: "PROBNP"  Imaging: No results found.   Assessment & Plan:   Annual physical exam - POCT URINALYSIS DIP  (CLINITEK)  - will check labs  2. Left hand pain  - predniSONE (DELTASONE) 20 MG tablet; Take 1 tablet (20 mg total) by mouth daily with breakfast for 5 days.  Dispense: 5 tablet; Refill: 0  Follow up:  Follow up in 6 months   Patient Instructions  1. Annual physical exam  - POCT URINALYSIS DIP (CLINITEK)  - will check labs  2. Left hand pain  - predniSONE (DELTASONE) 20 MG tablet; Take 1 tablet (  20 mg total) by mouth daily with breakfast for 5 days.  Dispense: 5 tablet; Refill: 0  Follow up:  Follow up in 6 months   Calorie Counting for Weight Loss Calories are units of energy. Your body needs a certain number of calories from food to keep going throughout the day. When you eat or drink more calories than your body needs, your body stores the extra calories mostly as fat. When you eat or drink fewer calories than your body needs, your body burns fat to get the energy it needs. Calorie counting means keeping track of how many calories you eat and drink each day. Calorie counting can be helpful if you need to lose weight. If you eat fewer calories than your body needs, you should lose weight. Ask your health care provider what a healthy weight is for you. For calorie counting to work, you will need to eat the right number of calories each day to lose a healthy amount of weight per week. A dietitian can help you figure out how many calories you need in a day and will suggest ways to reach your calorie goal. A healthy amount of weight to lose each week is usually 1-2 lb (0.5-0.9 kg). This usually means that your daily calorie intake should be reduced by 500-750 calories. Eating 1,200-1,500 calories a day can help most women lose weight. Eating 1,500-1,800 calories a day can help most men lose weight. What do I need to know about calorie counting? Work with your health care provider or dietitian to determine how many calories you should get each day. To meet your daily calorie goal,  you will need to: Find out how many calories are in each food that you would like to eat. Try to do this before you eat. Decide how much of the food you plan to eat. Keep a food log. Do this by writing down what you ate and how many calories it had. To successfully lose weight, it is important to balance calorie counting with a healthy lifestyle that includes regular activity. Where do I find calorie information?  The number of calories in a food can be found on a Nutrition Facts label. If a food does not have a Nutrition Facts label, try to look up the calories online or ask your dietitian for help. Remember that calories are listed per serving. If you choose to have more than one serving of a food, you will have to multiply the calories per serving by the number of servings you plan to eat. For example, the label on a package of bread might say that a serving size is 1 slice and that there are 90 calories in a serving. If you eat 1 slice, you will have eaten 90 calories. If you eat 2 slices, you will have eaten 180 calories. How do I keep a food log? After each time that you eat, record the following in your food log as soon as possible: What you ate. Be sure to include toppings, sauces, and other extras on the food. How much you ate. This can be measured in cups, ounces, or number of items. How many calories were in each food and drink. The total number of calories in the food you ate. Keep your food log near you, such as in a pocket-sized notebook or on an app or website on your mobile phone. Some programs will calculate calories for you and show you how many calories you have left to meet your daily  goal. What are some portion-control tips? Know how many calories are in a serving. This will help you know how many servings you can have of a certain food. Use a measuring cup to measure serving sizes. You could also try weighing out portions on a kitchen scale. With time, you will be able to  estimate serving sizes for some foods. Take time to put servings of different foods on your favorite plates or in your favorite bowls and cups so you know what a serving looks like. Try not to eat straight from a food's packaging, such as from a bag or box. Eating straight from the package makes it hard to see how much you are eating and can lead to overeating. Put the amount you would like to eat in a cup or on a plate to make sure you are eating the right portion. Use smaller plates, glasses, and bowls for smaller portions and to prevent overeating. Try not to multitask. For example, avoid watching TV or using your computer while eating. If it is time to eat, sit down at a table and enjoy your food. This will help you recognize when you are full. It will also help you be more mindful of what and how much you are eating. What are tips for following this plan? Reading food labels Check the calorie count compared with the serving size. The serving size may be smaller than what you are used to eating. Check the source of the calories. Try to choose foods that are high in protein, fiber, and vitamins, and low in saturated fat, trans fat, and sodium. Shopping Read nutrition labels while you shop. This will help you make healthy decisions about which foods to buy. Pay attention to nutrition labels for low-fat or fat-free foods. These foods sometimes have the same number of calories or more calories than the full-fat versions. They also often have added sugar, starch, or salt to make up for flavor that was removed with the fat. Make a grocery list of lower-calorie foods and stick to it. Cooking Try to cook your favorite foods in a healthier way. For example, try baking instead of frying. Use low-fat dairy products. Meal planning Use more fruits and vegetables. One-half of your plate should be fruits and vegetables. Include lean proteins, such as chicken, [place], and fish. Lifestyle Each week, aim to do  one of the following: 150 minutes of moderate exercise, such as walking. 75 minutes of vigorous exercise, such as running. General information Know how many calories are in the foods you eat most often. This will help you calculate calorie counts faster. Find a way of tracking calories that works for you. Get creative. Try different apps or programs if writing down calories does not work for you. What foods should I eat?  Eat nutritious foods. It is better to have a nutritious, high-calorie food, such as an avocado, than a food with few nutrients, such as a bag of potato chips. Use your calories on foods and drinks that will fill you up and will not leave you hungry soon after eating. Examples of foods that fill you up are nuts and nut butters, vegetables, lean proteins, and high-fiber foods such as whole grains. High-fiber foods are foods with more than 5 g of fiber per serving. Pay attention to calories in drinks. Low-calorie drinks include water and unsweetened drinks. The items listed above may not be a complete list of foods and beverages you can eat. Contact a dietitian for more  information. What foods should I limit? Limit foods or drinks that are not good sources of vitamins, minerals, or protein or that are high in unhealthy fats. These include: Candy. Other sweets. Sodas, specialty coffee drinks, alcohol, and juice. The items listed above may not be a complete list of foods and beverages you should avoid. Contact a dietitian for more information. How do I count calories when eating out? Pay attention to portions. Often, portions are much larger when eating out. Try these tips to keep portions smaller: Consider sharing a meal instead of getting your own. If you get your own meal, eat only half of it. Before you start eating, ask for a container and put half of your meal into it. When available, consider ordering smaller portions from the menu instead of full portions. Pay attention  to your food and drink choices. Knowing the way food is cooked and what is included with the meal can help you eat fewer calories. If calories are listed on the menu, choose the lower-calorie options. Choose dishes that include vegetables, fruits, whole grains, low-fat dairy products, and lean proteins. Choose items that are boiled, broiled, grilled, or steamed. Avoid items that are buttered, battered, fried, or served with cream sauce. Items labeled as crispy are usually fried, unless stated otherwise. Choose water, low-fat milk, unsweetened iced tea, or other drinks without added sugar. If you want an alcoholic beverage, choose a lower-calorie option, such as a glass of wine or light beer. Ask for dressings, sauces, and syrups on the side. These are usually high in calories, so you should limit the amount you eat. If you want a salad, choose a garden salad and ask for grilled meats. Avoid extra toppings such as bacon, cheese, or fried items. Ask for the dressing on the side, or ask for olive oil and vinegar or lemon to use as dressing. Estimate how many servings of a food you are given. Knowing serving sizes will help you be aware of how much food you are eating at restaurants. Where to find more information Centers for Disease Control and Prevention: http://www.wolf.info/ [place] Department of Agriculture: http://www.wilson-mendoza.org/ Summary Calorie counting means keeping track of how many calories you eat and drink each day. If you eat fewer calories than your body needs, you should lose weight. A healthy amount of weight to lose per week is usually 1-2 lb (0.5-0.9 kg). This usually means reducing your daily calorie intake by 500-750 calories. The number of calories in a food can be found on a Nutrition Facts label. If a food does not have a Nutrition Facts label, try to look up the calories online or ask your dietitian for help. Use smaller plates, glasses, and bowls for smaller portions and to prevent overeating. Use your  calories on foods and drinks that will fill you up and not leave you hungry shortly after a meal. This information is not intended to replace advice given to you by your health care provider. Make sure you discuss any questions you have with your health care provider. Document Revised: 09/11/2019 Document Reviewed: 09/11/2019 Elsevier Patient Education  2023 [place], [place] 05/10/2022

## 2022-03-03 LAB — COMPREHENSIVE METABOLIC PANEL
ALT: 25 IU/L (ref 0–44)
AST: 20 IU/L (ref 0–40)
Albumin/Globulin Ratio: 1.5 (ref 1.2–2.2)
Albumin: 4.3 g/dL (ref 3.8–4.9)
Alkaline Phosphatase: 62 IU/L (ref 44–121)
BUN/Creatinine Ratio: 10 (ref 9–20)
BUN: 10 mg/dL (ref 6–24)
Bilirubin Total: 0.5 mg/dL (ref 0.0–1.2)
CO2: 24 mmol/L (ref 20–29)
Calcium: 9 mg/dL (ref 8.7–10.2)
Chloride: 106 mmol/L (ref 96–106)
Creatinine, Ser: 0.99 mg/dL (ref 0.76–1.27)
Globulin, Total: 2.8 g/dL (ref 1.5–4.5)
Glucose: 75 mg/dL (ref 70–99)
Potassium: 4.4 mmol/L (ref 3.5–5.2)
Sodium: 144 mmol/L (ref 134–144)
Total Protein: 7.1 g/dL (ref 6.0–8.5)
eGFR: 91 mL/min/{1.73_m2} (ref >59)

## 2022-03-03 LAB — CBC
Hematocrit: 44.3 % (ref 37.5–51.0)
Hemoglobin: 14.4 g/dL (ref 13.0–17.7)
MCH: 27 pg (ref 26.6–33.0)
MCHC: 32.5 g/dL (ref 31.5–35.7)
MCV: 83 fL (ref 79–97)
Platelets: 241 10*3/uL (ref 150–450)
RBC: 5.34 x10E6/uL (ref 4.14–5.80)
RDW: 12.5 % (ref 11.6–15.4)
WBC: 7 10*3/uL (ref 3.4–10.8)

## 2022-03-03 LAB — LIPID PANEL
Chol/HDL Ratio: 3 ratio (ref 0.0–5.0)
Cholesterol, Total: 133 mg/dL (ref 100–199)
HDL: 44 mg/dL (ref >39)
LDL Chol Calc (NIH): 77 mg/dL (ref 0–99)
Triglycerides: 54 mg/dL (ref 0–149)
VLDL Cholesterol Cal: 12 mg/dL (ref 5–40)

## 2022-03-06 ENCOUNTER — Telehealth: Payer: Self-pay | Admitting: Physician Assistant

## 2022-03-06 NOTE — Telephone Encounter (Addendum)
I spoke to patient he is aware of medication being changed to Xarelto also discussed lab results advise per provider overall labs were normal.

## 2022-03-06 NOTE — Telephone Encounter (Signed)
Scheduled appt per 7/20 referral. Pt is aware of appt date and time. Pt is aware to arrive 15 mins prior to appt time and to bring and updated insurance card. Pt is aware of appt location.   

## 2022-03-31 ENCOUNTER — Inpatient Hospital Stay: Payer: BC Managed Care – PPO | Attending: Physician Assistant | Admitting: Physician Assistant

## 2022-03-31 ENCOUNTER — Encounter: Payer: Self-pay | Admitting: Physician Assistant

## 2022-03-31 ENCOUNTER — Inpatient Hospital Stay: Payer: BC Managed Care – PPO

## 2022-03-31 VITALS — BP 132/74 | HR 83 | Temp 97.7°F | Resp 15 | Wt 223.8 lb

## 2022-03-31 DIAGNOSIS — Z86718 Personal history of other venous thrombosis and embolism: Secondary | ICD-10-CM | POA: Diagnosis not present

## 2022-03-31 DIAGNOSIS — I82599 Chronic embolism and thrombosis of other specified deep vein of unspecified lower extremity: Secondary | ICD-10-CM

## 2022-03-31 DIAGNOSIS — K921 Melena: Secondary | ICD-10-CM | POA: Insufficient documentation

## 2022-03-31 DIAGNOSIS — Z7901 Long term (current) use of anticoagulants: Secondary | ICD-10-CM | POA: Diagnosis not present

## 2022-03-31 LAB — CMP (CANCER CENTER ONLY)
ALT: 23 U/L (ref 0–44)
AST: 17 U/L (ref 15–41)
Albumin: 3.8 g/dL (ref 3.5–5.0)
Alkaline Phosphatase: 51 U/L (ref 38–126)
Anion gap: 2 — ABNORMAL LOW (ref 5–15)
BUN: 13 mg/dL (ref 6–20)
CO2: 27 mmol/L (ref 22–32)
Calcium: 8.9 mg/dL (ref 8.9–10.3)
Chloride: 109 mmol/L (ref 98–111)
Creatinine: 1.32 mg/dL — ABNORMAL HIGH (ref 0.61–1.24)
GFR, Estimated: >60 mL/min (ref >60)
Glucose, Bld: 84 mg/dL (ref 70–99)
Potassium: 3.8 mmol/L (ref 3.5–5.1)
Sodium: 138 mmol/L (ref 135–145)
Total Bilirubin: 0.6 mg/dL (ref 0.3–1.2)
Total Protein: 6.9 g/dL (ref 6.5–8.1)

## 2022-03-31 LAB — CBC WITH DIFFERENTIAL (CANCER CENTER ONLY)
Abs Immature Granulocytes: 0.01 10*3/uL (ref 0.00–0.07)
Basophils Absolute: 0 10*3/uL (ref 0.0–0.1)
Basophils Relative: 1 %
Eosinophils Absolute: 0.4 10*3/uL (ref 0.0–0.5)
Eosinophils Relative: 7 %
HCT: 41.5 % (ref 39.0–52.0)
Hemoglobin: 13.6 g/dL (ref 13.0–17.0)
Immature Granulocytes: 0 %
Lymphocytes Relative: 30 %
Lymphs Abs: 1.8 10*3/uL (ref 0.7–4.0)
MCH: 27.3 pg (ref 26.0–34.0)
MCHC: 32.8 g/dL (ref 30.0–36.0)
MCV: 83.2 fL (ref 80.0–100.0)
Monocytes Absolute: 0.9 10*3/uL (ref 0.1–1.0)
Monocytes Relative: 15 %
Neutro Abs: 2.9 10*3/uL (ref 1.7–7.7)
Neutrophils Relative %: 47 %
Platelet Count: 196 10*3/uL (ref 150–400)
RBC: 4.99 MIL/uL (ref 4.22–5.81)
RDW: 13.9 % (ref 11.5–15.5)
WBC Count: 6 10*3/uL (ref 4.0–10.5)
nRBC: 0 % (ref 0.0–0.2)

## 2022-04-01 LAB — CARDIOLIPIN ANTIBODIES, IGG, IGM, IGA
Anticardiolipin IgA: <9 APL U/mL (ref 0–11)
Anticardiolipin IgG: <9 GPL U/mL (ref 0–14)
Anticardiolipin IgM: 23 MPL U/mL — ABNORMAL HIGH (ref 0–12)

## 2022-04-01 LAB — BETA-2-GLYCOPROTEIN I ABS, IGG/M/A
Beta-2 Glyco I IgG: <9 GPI IgG units (ref 0–20)
Beta-2-Glycoprotein I IgA: 10 GPI IgA units (ref 0–25)
Beta-2-Glycoprotein I IgM: 51 GPI IgM units — ABNORMAL HIGH (ref 0–32)

## 2022-04-01 NOTE — Progress Notes (Addendum)
[place] [phone]   Fax:([phone]) [place] NOTE  Patient Care Team: Fenton Foy, NP as PCP - General (Pulmonary Disease)  Hematological/Oncological History 1) 2011: DVT in left leg, felt to be provoked from sedentary lifestyle. Treated with Lovenox x 6 months.   2) 02/11/2016: DVT involving gastronemius vein of the right lower extremity coursing from the mid calf to the confluence with but including the popliteal vein. Treated with Xarelto x 6 months.   3) 07/22/2021: Age indeterminate deep vein thrombosis involving the left gastrocnemius veins. Prescribed Eliquis  4) 03/02/2022: Switched to Xarelto per patient preference to take [facility] therapy once a day.   5) 03/31/2022: Establish care with [facility]   CHIEF COMPLAINTS/PURPOSE OF CONSULTATION:  Recurrent DVTs  HISTORY OF PRESENTING ILLNESS:  Tommy Walters 51 y.o. male presents to the hematology clinic for evaluation for recurrent DVTs. Tommy Walters is unaccompanied for this visit.  On exam today, Tommy Walters reports that he is tolerating Xarelto without any significant limitations. He reports that he has held Xarelto for a few days before because he noticed some blood in the stool after he drank caffeine. He denies any nausea, vomiting, diarrhea or constipation. He denies fevers, chills, night sweats, shortness of breath, chest pain or cough. He has no other complaints. Rest of the 10 point ROS is below.   MEDICAL HISTORY:  Past Medical History:  Diagnosis Date   DVT (deep venous thrombosis) (HCC)    History of colon polyps 06/2019   Shortness of breath     SURGICAL HISTORY: Past Surgical History:  Procedure Laterality Date   NO PAST SURGERIES      SOCIAL HISTORY: Social History   Socioeconomic History   Marital status: Married    Spouse name: [name]   Number of children: 4   Years of education: MS   Highest education level: Not on file  Occupational History    Occupation: Mudlogger   Tobacco Use   Smoking status: Never   Smokeless tobacco: Never  Vaping Use   Vaping Use: Never used  Substance and Sexual Activity   Alcohol use: Not Currently    Alcohol/week: 1.0 standard drink of alcohol    Types: 1 Glasses of wine per week    Comment: occ   Drug use: No   Sexual activity: Yes    Birth control/protection: None  Other Topics Concern   Not on file  Social History Narrative   Lives with wife and 3 daughters.   Son is in school elsewhere.   Social Determinants of Health   Financial Resource Strain: Not on file  Food Insecurity: Not on file  Transportation Needs: Not on file  Physical Activity: Not on file  Stress: Not on file  Social Connections: Not on file  Intimate Partner Violence: Not on file    FAMILY HISTORY: Family History  Problem Relation Age of Onset   Hypertension Mother    Diabetes Mother    Stroke Mother 44       died from    Colon cancer Neg Hx    Colon polyps Neg Hx    Esophageal cancer Neg Hx    Rectal cancer Neg Hx    Stomach cancer Neg Hx     ALLERGIES:  is allergic to aspirin, caffeine, and cherry.  MEDICATIONS:  Current Outpatient Medications  Medication Sig Dispense Refill   acetaminophen (TYLENOL) 500 MG tablet Take 2 tablets (1,000 mg total) by mouth  every 6 (six) hours as needed for mild pain, fever or headache. 30 tablet 5   Cholecalciferol (VITAMIN D3 PO) Take 1 tablet by mouth daily.     Cyanocobalamin (B-12 PO) Take 1 tablet by mouth daily.     famotidine (PEPCID) 20 MG tablet Take 1 tablet (20 mg total) by mouth 2 (two) times daily. (Patient taking differently: Take 20 mg by mouth 2 (two) times daily. As needed) 90 tablet 1   Glucosamine Sulfate 1000 MG CAPS Take 1 capsule (1,000 mg total) by mouth 2 (two) times daily. 180 capsule 3   Melatonin-Pyridoxine (MELATIN PO) Take by mouth.     Multiple Vitamin (ONE-A-DAY MENS PO) Take by mouth.     Na Sulfate-K Sulfate-Mg Sulf 17.5-3.13-1.6  GM/177ML SOLN Suprep (no substitutions)-TAKE AS DIRECTED. 354 mL 0   rivaroxaban (XARELTO) 20 MG TABS tablet Take 1 tablet (20 mg total) by mouth daily with supper. 30 tablet 2   simvastatin (ZOCOR) 40 MG tablet Take 1 tablet (40 mg total) by mouth at bedtime. 90 tablet 3   Boswellia-Glucosamine-Vit D (OSTEO BI-FLEX ONE PER DAY PO) Take by mouth. (Patient not taking: Reported on 07/22/2021)     Coenzyme Q10 (CO Q 10 PO) Take 1 tablet by mouth daily. (Patient not taking: Reported on 03/02/2022)     No current facility-administered medications for this visit.    REVIEW OF SYSTEMS:   Constitutional: ( - ) fevers, ( - )  chills , ( - ) night sweats Eyes: ( - ) blurriness of vision, ( - ) double vision, ( - ) watery eyes Ears, nose, mouth, throat, and face: ( - ) mucositis, ( - ) sore throat Respiratory: ( - ) cough, ( - ) dyspnea, ( - ) wheezes Cardiovascular: ( - ) palpitation, ( - ) chest discomfort, ( - ) lower extremity swelling Gastrointestinal:  ( - ) nausea, ( - ) heartburn, ( - ) change in bowel habits Skin: ( - ) abnormal skin rashes Lymphatics: ( - ) new lymphadenopathy, ( - ) easy bruising Neurological: ( - ) numbness, ( - ) tingling, ( - ) new weaknesses Behavioral/Psych: ( - ) mood change, ( - ) new changes  All other systems were reviewed with the patient and are negative.  PHYSICAL EXAMINATION: ECOG PERFORMANCE STATUS: 0 - Asymptomatic  Vitals:   03/31/22 1422  BP: 132/74  Pulse: 83  Resp: 15  Temp: 97.7 F (36.5 C)  SpO2: 98%   Filed Weights   03/31/22 1422  Weight: 223 lb 12.8 oz (101.5 kg)    GENERAL: well appearing male in NAD  SKIN: skin color, texture, turgor are normal, no rashes or significant lesions EYES: conjunctiva are pink and non-injected, sclera clear OROPHARYNX: no exudate, no erythema; lips, buccal mucosa, and tongue normal  NECK: supple, non-tender LYMPH:  no palpable lymphadenopathy in the cervical or supraclavicular lymph nodes.  LUNGS: clear  to auscultation and percussion with normal breathing effort HEART: regular rate & rhythm and no murmurs and no lower extremity edema ABDOMEN: soft, non-tender, non-distended, normal bowel sounds Musculoskeletal: no cyanosis of digits and no clubbing  PSYCH: alert & oriented x 3, fluent speech NEURO: no focal motor/sensory deficits  LABORATORY DATA:  I have reviewed the data as listed    Latest Ref Rng & Units 03/31/2022    3:10 PM 03/02/2022    3:18 PM 09/02/2021    8:20 AM  CBC  WBC 4.0 - 10.5 K/uL 6.0  7.0  6.1   Hemoglobin 13.0 - 17.0 g/dL 13.6  14.4  15.3   Hematocrit 39.0 - 52.0 % 41.5  44.3  47.8   Platelets 150 - 400 K/uL 196  241  233        Latest Ref Rng & Units 03/31/2022    3:10 PM 03/02/2022    3:18 PM 09/02/2021    8:20 AM  CMP  Glucose 70 - 99 mg/dL 84  75  89   BUN 6 - 20 mg/dL '13  10  9   '$ Creatinine 0.61 - 1.24 mg/dL 1.32  0.99  0.95   Sodium 135 - 145 mmol/L 138  144  141   Potassium 3.5 - 5.1 mmol/L 3.8  4.4  4.4   Chloride 98 - 111 mmol/L 109  106  101   CO2 22 - 32 mmol/L 27  24    Calcium 8.9 - 10.3 mg/dL 8.9  9.0  9.7   Total Protein 6.5 - 8.1 g/dL 6.9  7.1  7.5   Total Bilirubin 0.3 - 1.2 mg/dL 0.6  0.5  0.4   Alkaline Phos 38 - 126 U/L 51  62  59   AST 15 - 41 U/L '17  20  27   '$ ALT 0 - 44 U/L 23  25       ASSESSMENT & PLAN Tommy Walters is a 51 y.o. male who presents to the hematology clinic for initial evaluation for recurrent DVTs. Due to recurrent DVTs and last two episodes felt to be unprovoked, recommend indefinite anticoagulation.   He is currently on Xarelto 20 mg once a day. He reports having occasional episodes of blood in the stools and he has held Xarelto for a short period.He denies any active bleeding currently so recommend to continue Xarelto at this time.   #Recurrent DVTs: --1st episode in 2011, felt to be provoked for sedentary lifestyle. Treated with Lovenox x 6 months --2nd episode in 2017, felt to be unprovoked. Treated with  Xarelto x 6 months.  --3rd episode in 2022, felt to be unprovoked. Started on Eliquis but patient requested to switch to Xarelto in July 2023 for convenience of taking medication once a day.  --Currently on Xarelto 20 mg once a day. Recommend to continue on current dose but consider maintenance dose 10 mg once a day if patient has recurrent episodes of rectal bleeding.  --Hypercoagulable workup from 10/07/2010 ruled out protein C and S deficiencies, factor 5 leiden mutation, prothrombin gene mutation.  --Labs today to check CBC, CMP, beta-2-glycoprotein and cardiolipin antibodies.  --RTC in 3 months with labs.   #Intermittent episodes of hematochezia: --Last colonoscopy was on 06/27/2019 which showed several polyps and diverticulosis.  --Discussed symptoms with Dr. Tarri Glenn (gastroenterology), who felt likely cause is due to his known internal hemorrhoids but will follow up with patient to rule out other causes.   Orders Placed This Encounter  Procedures   Beta-2-glycoprotein i abs, IgG/M/A    Standing Status:   Future    Number of Occurrences:   1    Standing Expiration Date:   04/01/2023   Cardiolipin antibodies, IgG, IgM, IgA    Standing Status:   Future    Number of Occurrences:   1    Standing Expiration Date:   04/01/2023   CBC with Differential (Cancer Center Only)    Standing Status:   Future    Number of Occurrences:   1    Standing Expiration Date:   04/01/2023  CMP ([place] only)    Standing Status:   Future    Number of Occurrences:   1    Standing Expiration Date:   04/01/2023    All questions were answered. The patient knows to call the clinic with any problems, questions or concerns.  I have spent a total of 60 minutes minutes of face-to-face and non-face-to-face time, preparing to see the patient, obtaining and/or reviewing separately obtained history, performing a medically appropriate examination, counseling and educating the patient, ordering tests/procedures,  documenting clinical information in the electronic health record, and care coordination.   Dede Query, PA-C Department of Hematology/Oncology [place] at [facility] Phone: [phone]

## 2022-04-03 ENCOUNTER — Telehealth: Payer: Self-pay

## 2022-04-03 ENCOUNTER — Other Ambulatory Visit: Payer: Self-pay

## 2022-04-03 MED ORDER — HYDROCORTISONE ACETATE 25 MG RE SUPP: 25.0000 mg | Freq: Two times a day (BID) | RECTAL | 0 refills | Status: AC

## 2022-04-03 NOTE — Telephone Encounter (Signed)
Th[place], MD  Carl Best, RN  Please schedule office visit with the next available APP. In the meantime, recommend Anusol HC suppositories BID x 10 days.   Thank you.   KLB

## 2022-04-03 NOTE — Telephone Encounter (Signed)
Spoke with patient & follow up has been scheduled for 05/12/22 at 2:00 pm with [place], [place]. Pt is aware of anusol suppositories that have been sent to pharmacy.

## 2022-04-07 ENCOUNTER — Telehealth: Payer: Self-pay | Admitting: Physician Assistant

## 2022-04-07 NOTE — Telephone Encounter (Signed)
I spoke to Mr. Tommy Walters to review the lab results from 03/31/2022.  Beta-2 glycoprotein IgM antibody was elevated at 51 units.  We recommend for patient to continue on Xarelto 20 mg once daily.  Patient will return in 3 months to repeat APS antibodies.  If there is persistent elevation, we will discuss switching to Coumadin.  Patient expressed understanding with the plan provided.

## 2022-05-03 ENCOUNTER — Ambulatory Visit: Payer: BC Managed Care – PPO | Admitting: Nurse Practitioner

## 2022-05-10 ENCOUNTER — Encounter: Payer: Self-pay | Admitting: Nurse Practitioner

## 2022-05-10 DIAGNOSIS — Z0000 Encounter for general adult medical examination without abnormal findings: Secondary | ICD-10-CM | POA: Insufficient documentation

## 2022-05-10 NOTE — Assessment & Plan Note (Signed)
-   POCT URINALYSIS DIP (CLINITEK)  - will check labs  2. Left hand pain  - predniSONE (DELTASONE) 20 MG tablet; Take 1 tablet (20 mg total) by mouth daily with breakfast for 5 days.  Dispense: 5 tablet; Refill: 0  Follow up:  Follow up in 6 months

## 2022-05-11 NOTE — Progress Notes (Signed)
05/12/2022 Tommy Walters [id number] [birth year]  Referring provider: Fenton Foy, NP Primary GI doctor: Dr. Tarri Glenn  ASSESSMENT AND PLAN:   History of adenomatous polyp of colon 06/27/2019 colonoscopy Dr. Tarri Glenn for screening purposes diverticulosis sigmoid colon descending and a sending, colonic mucosa entire colon, 1 mm benign polyp piecemeal, 3 tubular adenomas ranging from 3 to 4 mm in size, nonbleeding internal hemorrhoids. Recall 06/2022 We have discussed the risks of bleeding, infection, perforation, medication reactions, and remote risk of death associated with colonoscopy. All questions were answered and the patient acknowledges these risk and wishes to proceed.  Internal hemorrhoids Recent intermittent small volume bleeding Better with anusol, likely from his hemorrhoids, no change in BM's -Sitz baths, increase fiber, increase water, need to fix toilet habits.  -Hydrocortisone supp give and external cream sent in.   History of DVT (deep vein thrombosis) Patient told to hold his Xarelto for 2 days prior to time of procedure. Will instruct when and how to resume after procedure. We will communicate with his prescribing physician Perrin Maltese, NP  to ensure that holding his Xarelto is acceptable. We discussed the risk, benefits and alternatives to colonoscopy/endoscopy.  We also discussed the low but real risk of cardiovascular event such as heart attack, stroke, embolism, thrombosis or ischemia/infarct of other organs off Xarelto and explained the need to seek urgent help if this occurs.  He is agreeable and wishes to proceed.   History of Present Illness:  51 y.o. male  with a past medical history of HTN, DVT on  xarelto, and others listed below, returns to clinic today for evaluation of blood in stool.  06/27/2019 colonoscopy Dr. Tarri Glenn for screening purposes diverticulosis sigmoid colon descending and a sending, colonic mucosa entire colon, 1 mm benign polyp  piecemeal, 3 tubular adenomas ranging from 3 to 4 mm in size, nonbleeding internal hemorrhoids. Recall 06/2022  He states if he takes caffeine, he will have AB pain and have rectal bleeding.  States this most recent episode of bleeding was from coffee.  Has BRB very small intermittent amount, small volume- no rectal pain. No changes in his stools. Marland Kitchen  04/03/2022 patient called the office and send in Anusol suppositories twice daily for 10 days. Suppositories did help and it has stopped.   Patient has a BM every daily.  Patient denies change in bowel habits, constipation, diarrhea, hematochezia.  Denies changes in appetite, unintentional weight loss.  Patient does not know family history, unknown.  Patient denies GERD unless he eats hot peppers or very spicy food. Patient denies dysphagia, nausea, vomiting, melena.   He  reports that he has never smoked. He has never used smokeless tobacco. He reports that he does not currently use alcohol after a past usage of about 1.0 standard drink of alcohol per week. He reports that he does not use drugs. His family history includes Diabetes in his mother; Hypertension in his mother; Stroke (age of onset: 78) in his mother.   Current Medications:    Current Outpatient Medications (Cardiovascular):    simvastatin (ZOCOR) 40 MG tablet, Take 1 tablet (40 mg total) by mouth at bedtime.   Current Outpatient Medications (Analgesics):    acetaminophen (TYLENOL) 500 MG tablet, Take 2 tablets (1,000 mg total) by mouth every 6 (six) hours as needed for mild pain, fever or headache.  Current Outpatient Medications (Hematological):    Cyanocobalamin (B-12 PO), Take 1 tablet by mouth daily.   rivaroxaban (XARELTO) 20 MG TABS tablet, Take  1 tablet (20 mg total) by mouth daily with supper.  Current Outpatient Medications (Other):    Cholecalciferol (VITAMIN D3 PO), Take 1 tablet by mouth daily.   Glucosamine Sulfate 1000 MG CAPS, Take 1 capsule (1,000 mg  total) by mouth 2 (two) times daily.   Melatonin-Pyridoxine (MELATIN PO), Take by mouth.   Multiple Vitamin (ONE-A-DAY MENS PO), Take by mouth.   Na Sulfate-K Sulfate-Mg Sulf 17.5-3.13-1.6 GM/177ML SOLN, Suprep (no substitutions)-TAKE AS DIRECTED.  Surgical History:  He  has a past surgical history that includes No past surgeries.  Current Medications, Allergies, Past Medical History, Past Surgical History, Family History and Social History were reviewed in [employer] record.  Physical Exam: BP 134/64   Pulse 78   Ht '5\' 6"'$  (1.676 m)   Wt 231 lb 2 oz (104.8 kg)   BMI 37.30 kg/m  General:   Pleasant, well developed male in no acute distress Heart : Regular rate and rhythm; no murmurs Pulm: Clear anteriorly; no wheezing Abdomen:  Soft, Obese AB, Active bowel sounds. No tenderness . Without guarding and Without rebound, No organomegaly appreciated. Rectal: declines Extremities:  without  edema. Neurologic:  Alert and  oriented x4;  No focal deficits.  Psych:  Cooperative. Normal mood and affect.   Vladimir Crofts, PA-C 05/12/22

## 2022-05-12 ENCOUNTER — Encounter: Payer: Self-pay | Admitting: Physician Assistant

## 2022-05-12 ENCOUNTER — Telehealth: Payer: Self-pay

## 2022-05-12 ENCOUNTER — Ambulatory Visit (INDEPENDENT_AMBULATORY_CARE_PROVIDER_SITE_OTHER): Payer: BC Managed Care – PPO | Admitting: Physician Assistant

## 2022-05-12 VITALS — BP 134/64 | HR 78 | Ht 66.0 in | Wt 231.1 lb

## 2022-05-12 DIAGNOSIS — Z86010 Personal history of colonic polyps: Secondary | ICD-10-CM | POA: Diagnosis not present

## 2022-05-12 DIAGNOSIS — Z86718 Personal history of other venous thrombosis and embolism: Secondary | ICD-10-CM

## 2022-05-12 DIAGNOSIS — K648 Other hemorrhoids: Secondary | ICD-10-CM

## 2022-05-12 MED ORDER — NA SULFATE-K SULFATE-MG SULF 17.5-3.13-1.6 GM/177ML PO SOLN: 1.0000 | Freq: Once | ORAL | 0 refills | Status: AC

## 2022-05-12 NOTE — Telephone Encounter (Signed)
[facility] Pre-operative Risk Assessment     Request for surgical clearance:     Endoscopy Procedure  What type of surgery is being performed?     Colonoscopy   When is this surgery scheduled?     06-30-2022  What type of clearance is required ?   Pharmacy  Are there any medications that need to be held prior to surgery and how long? Yes, Xarelto, 2 days  Practice name and name of physician performing surgery?      [facility]  What is your office phone and fax number?      Phone- [phone]  [phone]  Anesthesia type (None, local, MAC, general) ?       MAC

## 2022-05-12 NOTE — Patient Instructions (Addendum)
_______________________________________________________  If you are age 53 or older, your body mass index should be between 23-30. Your Body mass index is 37.3 kg/m. If this is out of the aforementioned range listed, please consider follow up with your Primary Care Provider.  If you are age 13 or younger, your body mass index should be between 19-25. Your Body mass index is 37.3 kg/m. If this is out of the aformentioned range listed, please consider follow up with your Primary Care Provider.   ________________________________________________________  The [facility] providers would like to encourage you to use [facility] to communicate with providers for non-urgent requests or questions.  Due to long hold times on the telephone, sending your provider a message by [facility] may be a faster and more efficient way to get a response.  Please allow 48 business hours for a response.  Please remember that this is for non-urgent requests.  _______________________________________________________  Tommy Walters have been scheduled for a colonoscopy. Please follow written instructions given to you at your visit today.  Please pick up your prep supplies at the pharmacy within the next 1-3 days. If you use inhalers (even only as needed), please bring them with you on the day of your procedure.  Due to recent changes in healthcare laws, you may see the results of your imaging and laboratory studies on MyChart before your provider has had a chance to review them.  We understand that in some cases there may be results that are confusing or concerning to you. Not all laboratory results come back in the same time frame and the provider may be waiting for multiple results in order to interpret others.  Please give [place] 48 hours in order for your provider to thoroughly review all the results before contacting the office for clarification of your results.      Please do sitz baths- these can be found at the pharmacy. It is a Artist that is put in your toliet.  Please increase fiber or add benefiber, increase water and increase acitivity.  Avoid coffee/caffeine  About Hemorrhoids  Hemorrhoids are swollen veins in the lower rectum and anus.  Also called piles, hemorrhoids are a common problem.  Hemorrhoids may be internal (inside the rectum) or external (around the anus).  Internal Hemorrhoids  Internal hemorrhoids are often painless, but they rarely cause bleeding.  The internal veins may stretch and fall down (prolapse) through the anus to the outside of the body.  The veins may then become irritated and painful  How Hemorrhoids Occur  Veins in the rectum and around the anus tend to swell under pressure.  Hemorrhoids can result from increased pressure in the veins of your anus or rectum.  Some sources of pressure are:  Straining to have a bowel movement because of constipation Waiting too long to have a bowel movement Coughing and sneezing often Sitting for extended periods of time, including on the toilet Diarrhea Obesity Trauma or injury to the anus Some liver diseases Stress Family history of hemorrhoids Pregnancy  Pregnant women should try to avoid becoming constipated, because they are more likely to have hemorrhoids during pregnancy.  In the last trimester of pregnancy, the enlarged uterus may press on blood vessels and causes hemorrhoids.  In addition, the strain of childbirth sometimes causes hemorrhoids after the birth.  Symptoms of Hemorrhoids  Some symptoms of hemorrhoids include: Swelling and/or a tender lump around the anus Itching, mild burning and bleeding around the anus Painful bowel movements with or without  constipation Bright red blood covering the stool, on toilet paper or in the toilet bowel.   Symptoms usually go away within a few days.  Always talk to your doctor about any bleeding to make sure it is not from some other causes.  Diagnosing and Treating  Hemorrhoids  Diagnosis is made by an examination by your healthcare provider.  Special test can be performed by your doctor.    Most cases of hemorrhoids can be treated with: High-fiber diet: Eat more high-fiber foods, which help prevent constipation.  Ask for more detailed fiber information on types and sources of fiber from your healthcare provider. Fluids: Drink plenty of water.  This helps soften bowel movements so they are easier to pass. Sitz baths and cold packs: Sitting in lukewarm water two or three times a day for 15 minutes cleases the anal area and may relieve discomfort.  If the water is too hot, swelling around the anus will get worse.  Placing a cloth-covered ice pack on the anus for ten minutes four times a day can also help reduce selling.  Gently pushing a prolapsed hemorrhoid back inside after the bath or ice pack can be helpful. Medications: For mild discomfort, your healthcare provider may suggest over-the-counter pain medication or prescribe a cream or ointment for topical use.  The cream may contain witch hazel, zinc oxide or petroleum jelly.  Medicated suppositories are also a treatment option.  Always consult your doctor before applying medications or creams. Procedures and surgeries: There are also a number of procedures and surgeries to shrink or remove hemorrhoids in more serious cases.  Talk to your physician about these options.  You can often prevent hemorrhoids or keep them from becoming worse by maintaining a healthy lifestyle.  Eat a fiber-rich diet of fruits, vegetables and whole grains.  Also, drink plenty of water and exercise regularly.   2007, [employer] Doc.30  Diverticulosis Diverticulosis is a condition that develops when small pouches (diverticula) form in the wall of the large intestine (colon). The colon is where water is absorbed and stool (feces) is formed. The pouches form when the inside layer of the colon pushes through weak spots in  the outer layers of the colon. You may have a few pouches or many of them. The pouches usually do not cause problems unless they become inflamed or infected. When this happens, the condition is called diverticulitis- this is left lower quadrant pain, diarrhea, fever, chills, nausea or vomiting.  If this occurs please call the office or go to the hospital. Sometimes these patches without inflammation can also have painless bleeding associated with them, if this happens please call the office or go to the hospital. Preventing constipation and increasing fiber can help reduce diverticula and prevent complications. Even if you feel you have a high-fiber diet, suggest getting on Benefiber or Cirtracel 2 times daily.

## 2022-05-12 NOTE — Telephone Encounter (Signed)
Please advise on hold for Xarelto prior to scheduled colonoscopy. Thank you.

## 2022-05-12 NOTE — Telephone Encounter (Signed)
   Patient Name: Tommy Walters  DOB: 03/14/1971 MRN: [id number]  Primary Cardiologist: None  Chart reviewed as part of pre-operative protocol coverage.   Pt does not follow with HeartCare. Xarelto is managed per PCP/hematology. Recommendations for holding Xarelto prior to procedure should come from the managing provider.   I will this recommendation to the requesting party and remove this request from the preop pool.  Please call with any questions.  Lenna Sciara, NP 05/12/2022, 4:05 PM

## 2022-05-15 ENCOUNTER — Telehealth: Payer: Self-pay

## 2022-05-15 NOTE — Telephone Encounter (Signed)
Pre-operative Risk Assessment     Request for surgical clearance:     Endoscopy Procedure  What type of surgery is being performed?     Colonoscopy   When is this surgery scheduled?     06-30-2022  What type of clearance is required ?   Pharmacy  Are there any medications that need to be held prior to surgery and how long? Yes, Xarelto, 2 days  Practice name and name of physician performing surgery?      [facility]  What is your office phone and fax number?      Phone- [phone]  [phone]  Anesthesia type (None, local, MAC, general) ?       MAC

## 2022-05-17 NOTE — Telephone Encounter (Signed)
Patient has been notified and aware to hold his Xarelto for 2 days before the procedure.

## 2022-06-02 NOTE — Progress Notes (Signed)
Reviewed and agree with management plans. ? ?Adja Ruff L. Brantleigh Mifflin, MD, MPH  ?

## 2022-06-19 ENCOUNTER — Other Ambulatory Visit: Payer: Self-pay | Admitting: Nurse Practitioner

## 2022-06-19 DIAGNOSIS — I824Z9 Acute embolism and thrombosis of unspecified deep veins of unspecified distal lower extremity: Secondary | ICD-10-CM

## 2022-06-23 ENCOUNTER — Other Ambulatory Visit: Payer: Self-pay | Admitting: Nurse Practitioner

## 2022-06-23 DIAGNOSIS — E785 Hyperlipidemia, unspecified: Secondary | ICD-10-CM

## 2022-06-23 NOTE — Telephone Encounter (Signed)
Zarelto Cholesterol refills

## 2022-06-26 ENCOUNTER — Encounter: Payer: Self-pay | Admitting: Gastroenterology

## 2022-06-30 ENCOUNTER — Encounter: Payer: Self-pay | Admitting: Gastroenterology

## 2022-06-30 ENCOUNTER — Ambulatory Visit (AMBULATORY_SURGERY_CENTER): Payer: BC Managed Care – PPO | Admitting: Gastroenterology

## 2022-06-30 VITALS — BP 125/68 | HR 69 | Temp 98.6°F | Resp 21 | Ht 66.0 in | Wt 231.0 lb

## 2022-06-30 DIAGNOSIS — D122 Benign neoplasm of ascending colon: Secondary | ICD-10-CM

## 2022-06-30 DIAGNOSIS — D125 Benign neoplasm of sigmoid colon: Secondary | ICD-10-CM | POA: Diagnosis not present

## 2022-06-30 DIAGNOSIS — D123 Benign neoplasm of transverse colon: Secondary | ICD-10-CM | POA: Diagnosis not present

## 2022-06-30 DIAGNOSIS — Z09 Encounter for follow-up examination after completed treatment for conditions other than malignant neoplasm: Secondary | ICD-10-CM | POA: Diagnosis not present

## 2022-06-30 DIAGNOSIS — Z86010 Personal history of colonic polyps: Secondary | ICD-10-CM

## 2022-06-30 DIAGNOSIS — D128 Benign neoplasm of rectum: Secondary | ICD-10-CM | POA: Diagnosis not present

## 2022-06-30 DIAGNOSIS — Z1211 Encounter for screening for malignant neoplasm of colon: Secondary | ICD-10-CM | POA: Diagnosis not present

## 2022-06-30 DIAGNOSIS — D127 Benign neoplasm of rectosigmoid junction: Secondary | ICD-10-CM | POA: Diagnosis not present

## 2022-06-30 MED ORDER — SODIUM CHLORIDE 0.9 % IV SOLN: 500.0000 mL | Freq: Once | INTRAVENOUS | Status: DC

## 2022-06-30 NOTE — Patient Instructions (Signed)
Resume previous diet and medications. Resume Xarelto at prior dose tomorrow. Awaiting pathology results. Repeat Colonoscopy in 3 years for surveillance. Follow a high fiber diet and drink at least 64 ounces of water daily.  YOU HAD AN ENDOSCOPIC PROCEDURE TODAY AT [facility]:   Refer to the procedure report that was given to you for any specific questions about what was found during the examination.  If the procedure report does not answer your questions, please call your gastroenterologist to clarify.  If you requested that your care partner not be given the details of your procedure findings, then the procedure report has been included in a sealed envelope for you to review at your convenience later.  YOU SHOULD EXPECT: Some feelings of bloating in the abdomen. Passage of more gas than usual.  Walking can help get rid of the air that was put into your GI tract during the procedure and reduce the bloating. If you had a lower endoscopy (such as a colonoscopy or flexible sigmoidoscopy) you may notice spotting of blood in your stool or on the toilet paper. If you underwent a bowel prep for your procedure, you may not have a normal bowel movement for a few days.  Please Note:  You might notice some irritation and congestion in your nose or some drainage.  This is from the oxygen used during your procedure.  There is no need for concern and it should clear up in a day or so.  SYMPTOMS TO REPORT IMMEDIATELY:  Following lower endoscopy (colonoscopy or flexible sigmoidoscopy):  Excessive amounts of blood in the stool  Significant tenderness or worsening of abdominal pains  Swelling of the abdomen that is new, acute  Fever of 100F or higher  For urgent or emergent issues, a gastroenterologist can be reached at any hour by calling [phone]. Do not use MyChart messaging for urgent concerns.    DIET:  We do recommend a small meal at first, but then you may proceed to your regular  diet.  Drink plenty of fluids but you should avoid alcoholic beverages for 24 hours.  ACTIVITY:  You should plan to take it easy for the rest of today and you should NOT DRIVE or use heavy machinery until tomorrow (because of the sedation medicines used during the test).    FOLLOW UP: Our staff will call the number listed on your records the next business day following your procedure.  We will call around 7:15- 8:00 am to check on you and address any questions or concerns that you may have regarding the information given to you following your procedure. If we do not reach you, we will leave a message.     If any biopsies were taken you will be contacted by phone or by letter within the next 1-3 weeks.  Please call us at [phone] if you have not heard about the biopsies in 3 weeks.    SIGNATURES/CONFIDENTIALITY: You and/or your care partner have signed paperwork which will be entered into your electronic medical record.  These signatures attest to the fact that that the information above on your After Visit Summary has been reviewed and is understood.  Full responsibility of the confidentiality of this discharge information lies with you and/or your care-partner.

## 2022-06-30 NOTE — Progress Notes (Signed)
Referring Provider: Fenton Foy, NP Primary Care Physician:  Fenton Foy, NP  Indication for Procedure:  Colon cancer Surveillance   IMPRESSION:  Need for colon cancer surveillance Appropriate candidate for monitored anesthesia care  PLAN: Colonoscopy in the [place] today   HPI: Tommy Walters is a 51 y.o. male presents for surveillance colonoscopy.  Prior endoscopic history: Colonoscopy 06/27/19 - Diverticulosis in the sigmoid colon, in the descending colon and in the ascending colon. - Melanotic mucosa in the entire examined colon. - 3 small tubular adenomas, 1 benign polyp - Non-bleeding internal hemorrhoids.  No known family history of colon cancer or polyps. No family history of uterine/endometrial cancer, pancreatic cancer or gastric/stomach cancer.   Past Medical History:  Diagnosis Date   DVT (deep venous thrombosis) (HCC)    History of colon polyps 06/2019   Shortness of breath     Past Surgical History:  Procedure Laterality Date   NO PAST SURGERIES      Current Outpatient Medications  Medication Sig Dispense Refill   Cholecalciferol (VITAMIN D3 PO) Take 1 tablet by mouth daily.     Cyanocobalamin (B-12 PO) Take 1 tablet by mouth daily.     Melatonin-Pyridoxine (MELATIN PO) Take by mouth.     Multiple Vitamin (ONE-A-DAY MENS PO) Take by mouth.     simvastatin (ZOCOR) 40 MG tablet TAKE 1 TABLET BY MOUTH ONCE DAILY AT BEDTIME 90 tablet 0   acetaminophen (TYLENOL) 500 MG tablet Take 2 tablets (1,000 mg total) by mouth every 6 (six) hours as needed for mild pain, fever or headache. 30 tablet 5   Glucosamine Sulfate 1000 MG CAPS Take 1 capsule (1,000 mg total) by mouth 2 (two) times daily. (Patient not taking: Reported on 06/30/2022) 180 capsule 3   rivaroxaban (XARELTO) 20 MG TABS tablet Take 1 tablet (20 mg total) by mouth daily with supper. 30 tablet 2   Current Facility-Administered Medications  Medication Dose Route Frequency Provider Last Rate  Last Admin   0.9 %  sodium chloride infusion  500 mL Intravenous Once Thornton Park, MD        Allergies as of 06/30/2022 - Review Complete 06/30/2022  Allergen Reaction Noted   Aspirin Other (See Comments) 03/02/2012   Caffeine Other (See Comments) 03/02/2016   Cherry Nausea And Vomiting 03/02/2016    Family History  Problem Relation Age of Onset   Hypertension Mother    Diabetes Mother    Stroke Mother 83       died from    Colon cancer Neg Hx    Colon polyps Neg Hx    Esophageal cancer Neg Hx    Rectal cancer Neg Hx    Stomach cancer Neg Hx      Physical Exam: General:   Alert,  well-nourished, pleasant and cooperative in NAD Head:  Normocephalic and atraumatic. Eyes:  Sclera clear, no icterus.   Conjunctiva pink. Mouth:  No deformity or lesions.   Neck:  Supple; no masses or thyromegaly. Lungs:  Clear throughout to auscultation.   No wheezes. Heart:  Regular rate and rhythm; no murmurs. Abdomen:  Soft, non-tender, nondistended, normal bowel sounds, no rebound or guarding.  Msk:  Symmetrical. No boney deformities LAD: No inguinal or umbilical LAD Extremities:  No clubbing or edema. Neurologic:  Alert and  oriented x4;  grossly nonfocal Skin:  No obvious rash or bruise. Psych:  Alert and cooperative. Normal mood and affect.     Studies/Results: No results found.  Patra Gherardi L. Tarri Glenn, MD, MPH 06/30/2022, 11:26 AM

## 2022-06-30 NOTE — Progress Notes (Signed)
Report to PACU, RN, vss, BBS= Clear.  

## 2022-06-30 NOTE — Op Note (Signed)
[facility] Patient Name: Tommy Walters Procedure Date: 06/30/2022 11:27 AM MRN: [id number] Endoscopist: Thornton Park MD, MD, [id number] Age: 51 Referring MD:  Date of Birth: 1970-12-18 Gender: Male Account #: [id number] Procedure:                Colonoscopy Indications:              High risk colon cancer surveillance: Personal                            history of multiple (3 or more) adenomas                           3 tubular adenomas removed on colonoscopy 2020 Medicines:                Monitored Anesthesia Care Procedure:                Pre-Anesthesia Assessment:                           - Prior to the procedure, a History and Physical                            was performed, and patient medications and                            allergies were reviewed. The patient's tolerance of                            previous anesthesia was also reviewed. The risks                            and benefits of the procedure and the sedation                            options and risks were discussed with the patient.                            All questions were answered, and informed consent                            was obtained. Prior Anticoagulants: The patient has                            taken Xarelto (rivaroxaban), last dose was 2 days                            prior to procedure. ASA Grade Assessment: II - A                            patient with mild systemic disease. After reviewing                            the risks and benefits, the patient was deemed in  satisfactory condition to undergo the procedure.                           After obtaining informed consent, the colonoscope                            was passed under direct vision. Throughout the                            procedure, the patient's blood pressure, pulse, and                            oxygen saturations were monitored continuously. The                             Olympus CF-HQ190L ([id number]) Colonoscope was                            introduced through the anus and advanced to the 3                            cm into the ileum. A second forward view of the                            right colon was performed. The colonoscopy was                            performed without difficulty. The patient tolerated                            the procedure well. The quality of the bowel                            preparation was good. The terminal ileum, ileocecal                            valve, appendiceal orifice, and rectum were                            photographed. Scope In: 11:41:03 AM Scope Out: 11:56:13 AM Scope Withdrawal Time: 0 hours 10 minutes 29 seconds  Total Procedure Duration: 0 hours 15 minutes 10 seconds  Findings:                 The perianal and digital rectal examinations were                            normal.                           Non-bleeding internal hemorrhoids were found.                           Multiple medium-mouthed and small-mouthed  diverticula were found in the entire colon.                           A 1 mm polyp was found in the rectum. The polyp was                            flat. The polyp was removed with a cold snare.                            Resection and retrieval were complete. Estimated                            blood loss was minimal.                           A 3 mm polyp was found in the sigmoid colon. The                            polyp was sessile. The polyp was removed with a                            cold snare. Resection and retrieval were complete.                            Estimated blood loss was minimal.                           A 2 mm polyp was found in the hepatic flexure. The                            polyp was flat. The polyp was removed with a cold                            snare. Resection and retrieval were complete.                             Estimated blood loss was minimal.                           Two sessile polyps were found in the ascending                            colon. The polyps were 2 to 5 mm in size. These                            polyps were removed with a cold snare. Resection                            and retrieval were complete. Estimated blood loss                            was minimal.  The exam was otherwise without abnormality on                            direct and retroflexion views. Complications:            No immediate complications. Estimated Blood Loss:     Estimated blood loss was minimal. Impression:               - Non-bleeding internal hemorrhoids.                           - Diverticulosis in the entire examined colon.                           - One 1 mm polyp in the rectum, removed with a cold                            snare. Resected and retrieved.                           - One 3 mm polyp in the sigmoid colon, removed with                            a cold snare. Resected and retrieved.                           - One 2 mm polyp at the hepatic flexure, removed                            with a cold snare. Resected and retrieved.                           - Two 2 to 5 mm polyps in the ascending colon,                            removed with a cold snare. Resected and retrieved.                           - The examination was otherwise normal on direct                            and retroflexion views. Recommendation:           - Patient has a contact number available for                            emergencies. The signs and symptoms of potential                            delayed complications were discussed with the                            patient. Return to normal activities tomorrow.  Written discharge instructions were provided to the                            patient.                           - Resume previous diet.                            - Continue present medications.                           - Resume Xarelto (rivaroxaban) at prior dose                            tomorrow.                           - Await pathology results.                           - Repeat colonoscopy in 3 years for surveillance.                           - Follow a high fiber diet. Drink at least 64                            ounces of water daily. Add a daily stool bulking                            agent such as psyllium (an exampled would be                            Metamucil).                           - Emerging evidence supports eating a diet of                            fruits, vegetables, grains, calcium, and yogurt                            while reducing red meat and alcohol may reduce the                            risk of colon cancer.                           - Thank you for allowing me to be involved in your                            colon cancer prevention. Thornton Park MD, MD 06/30/2022 12:06:09 PM This report has been signed electronically.

## 2022-06-30 NOTE — Progress Notes (Signed)
Pt's states no medical or surgical changes since previsit or office visit. 

## 2022-06-30 NOTE — Progress Notes (Signed)
Called to room to assist during endoscopic procedure.  Patient ID and intended procedure confirmed with present staff. Received instructions for my participation in the procedure from the performing physician.  

## 2022-07-03 ENCOUNTER — Telehealth: Payer: Self-pay | Admitting: *Deleted

## 2022-07-03 ENCOUNTER — Other Ambulatory Visit: Payer: Self-pay

## 2022-07-03 DIAGNOSIS — I824Z9 Acute embolism and thrombosis of unspecified deep veins of unspecified distal lower extremity: Secondary | ICD-10-CM

## 2022-07-03 DIAGNOSIS — E785 Hyperlipidemia, unspecified: Secondary | ICD-10-CM

## 2022-07-03 MED ORDER — SIMVASTATIN 40 MG PO TABS: 40.0000 mg | ORAL_TABLET | Freq: Every day | ORAL | 0 refills | Status: DC

## 2022-07-03 MED ORDER — RIVAROXABAN 20 MG PO TABS: 20.0000 mg | ORAL_TABLET | Freq: Every day | ORAL | 2 refills | Status: DC

## 2022-07-03 NOTE — Telephone Encounter (Signed)
  Follow up Call-     06/30/2022   10:58 AM  Call back number  Post procedure Call Back phone  # [phone]  Permission to leave phone message Yes     Patient questions:  Do you have a fever, pain , or abdominal swelling? No. Pain Score  0 *  Have you tolerated food without any problems? Yes.    Have you been able to return to your normal activities? Yes.    Do you have any questions about your discharge instructions: Diet   No. Medications  No. Follow up visit  No.  Do you have questions or concerns about your Care? No.  Actions: * If pain score is 4 or above: No action needed, pain <4.

## 2022-07-03 NOTE — Telephone Encounter (Signed)
From: Hadassah Pais To: Office of Vevelyn Francois, [place] Sent: 07/01/2022 4:14 PM EST Subject: Medication Renewal Request  Refills have been requested for the following medications:   simvastatin (ZOCOR) 40 MG tablet Kenney Houseman [address] [place]]  Preferred pharmacy: [facility] [place], [place] - [address] Delivery method: Pickup   Medication renewals requested in this message routed separately:   rivaroxaban (XARELTO) 20 MG TABS tablet Kenney Houseman [address] [place]]

## 2022-07-04 ENCOUNTER — Other Ambulatory Visit (HOSPITAL_BASED_OUTPATIENT_CLINIC_OR_DEPARTMENT_OTHER): Payer: BC Managed Care – PPO | Admitting: Hematology and Oncology

## 2022-07-04 DIAGNOSIS — I82599 Chronic embolism and thrombosis of other specified deep vein of unspecified lower extremity: Secondary | ICD-10-CM | POA: Diagnosis not present

## 2022-07-04 NOTE — Progress Notes (Unsigned)
Tommy Walters   [phone]  PROGRESS NOTE  Patient Care Team: Tommy Foy, NP as PCP - General (Pulmonary Disease)  Hematological/Oncological History # Recurrent DVTs 1) 2011: DVT in left leg, felt to be provoked from sedentary lifestyle. Treated with Lovenox x 6 months.    2) 02/11/2016: DVT involving gastronemius vein of the right lower extremity coursing from the mid calf to the confluence with but including the popliteal vein. Treated with Tommy Walters x 6 months.    3) 07/22/2021: Age indeterminate deep vein thrombosis involving the left gastrocnemius veins. Prescribed Eliquis   4) 03/02/2022: Switched to Tommy Walters per patient preference to take [name] once a day.    5) 03/31/2022: Establish care with [facility] Hematology   Interval History:  Tommy Walters 51 y.o. male with medical history significant for recurrent lower extremity DVTs who presents for a follow up visit. The patient's last visit was on 03/31/2022 at which time he established care. In the interim since the last visit he transitioned to Tommy Walters Walters.  On exam today Tommy Walters notes that he would like to transition back to Eliquis.  He is taking the Tommy Walters but reports that it is $60 a month and the Eliquis was $10 per month.  He is not having any difficulty with the blood thinner.  He is not having any trouble with bleeding, bruising, or dark stools.  He is not having any signs or symptoms concerning for recurrent VTE.  He reports that he exercises every day and wears compression socks.  He is not having any nausea, vomiting, or diarrhea.  He is not having any shortness of breath or cough.  He also denies any fevers, chills, sweats.  A full 10 point ROS was otherwise negative.  Today we discussed the risks and benefits of Eliquis for Tommy Walters Walters.  He predominately wants to change due to the cost we will put him back on Eliquis at maintenance dose 2.5 mg twice  daily.  MEDICAL HISTORY:  Past Medical History:  Diagnosis Date   DVT (deep venous thrombosis) (HCC)    History of colon polyps 06/2019   Shortness of breath     SURGICAL HISTORY: Past Surgical History:  Procedure Laterality Date   NO PAST SURGERIES      SOCIAL HISTORY: Social History   Socioeconomic History   Marital status: Married    Spouse name: Tommy Walters   Number of children: 4   Years of education: MS   Highest education level: Not on file  Occupational History   Occupation: Mudlogger   Tobacco Use   Smoking status: Never   Smokeless tobacco: Never  Vaping Use   Vaping Use: Never used  Substance and Sexual Activity   Alcohol use: Not Currently    Alcohol/week: 1.0 standard drink of alcohol    Types: 1 Glasses of wine per week    Comment: occ   Drug use: No   Sexual activity: Yes    Birth control/protection: None  Other Topics Concern   Not on file  Social History Narrative   Lives with wife and 3 daughters.   Son is in school elsewhere.   Social Determinants of Health   Financial Resource Strain: Not on file  Food Insecurity: Not on file  Transportation Needs: Not on file  Physical Activity: Not on file  Stress: Not on file  Social Connections: Not on file  Intimate Partner Violence: Not on file    FAMILY  HISTORY: Family History  Problem Relation Age of Onset   Hypertension Mother    Diabetes Mother    Stroke Mother 59       died from    Colon cancer Neg Hx    Colon polyps Neg Hx    Esophageal cancer Neg Hx    Rectal cancer Neg Hx    Stomach cancer Neg Hx     ALLERGIES:  is allergic to aspirin, caffeine, and cherry.  MEDICATIONS:  Current Outpatient Medications  Medication Sig Dispense Refill   apixaban (ELIQUIS) 2.5 MG TABS tablet Take 1 tablet (2.5 mg total) by mouth 2 (two) times daily. 180 tablet 2   acetaminophen (TYLENOL) 500 MG tablet Take 2 tablets (1,000 mg total) by mouth every 6 (six) hours as needed for mild pain,  fever or headache. 30 tablet 5   Cholecalciferol (VITAMIN D3 PO) Take 1 tablet by mouth daily.     Cyanocobalamin (B-12 PO) Take 1 tablet by mouth daily.     Glucosamine Sulfate 1000 MG CAPS Take 1 capsule (1,000 mg total) by mouth 2 (two) times daily. (Patient not taking: Reported on 06/30/2022) 180 capsule 3   Melatonin-Pyridoxine (MELATIN PO) Take by mouth.     Multiple Vitamin (ONE-A-DAY MENS PO) Take by mouth.     simvastatin (ZOCOR) 40 MG tablet Take 1 tablet (40 mg total) by mouth at bedtime. 90 tablet 0   No current facility-administered medications for this visit.    REVIEW OF SYSTEMS:   Constitutional: ( - ) fevers, ( - )  chills , ( - ) night sweats Eyes: ( - ) blurriness of vision, ( - ) double vision, ( - ) watery eyes Ears, nose, mouth, throat, and face: ( - ) mucositis, ( - ) sore throat Respiratory: ( - ) cough, ( - ) dyspnea, ( - ) wheezes Cardiovascular: ( - ) palpitation, ( - ) chest discomfort, ( - ) lower extremity swelling Gastrointestinal:  ( - ) nausea, ( - ) heartburn, ( - ) change in bowel habits Skin: ( - ) abnormal skin rashes Lymphatics: ( - ) new lymphadenopathy, ( - ) easy bruising Neurological: ( - ) numbness, ( - ) tingling, ( - ) new weaknesses Behavioral/Psych: ( - ) mood change, ( - ) new changes  All other systems were reviewed with the patient and are negative.  PHYSICAL EXAMINATION:  Vitals:   07/05/22 1505  BP: (!) 140/84  Pulse: 69  Resp: 18  Temp: 97.9 F (36.6 C)  SpO2: 100%   Filed Weights   07/05/22 1505  Weight: 226 lb 9.6 oz (102.8 kg)    GENERAL: Well-appearing middle-aged African-American male, alert, no distress and comfortable SKIN: skin color, texture, turgor are normal, no rashes or significant lesions EYES: conjunctiva are pink and non-injected, sclera clear LUNGS: clear to auscultation and percussion with normal breathing effort HEART: regular rate & rhythm and no murmurs and no lower extremity edema Musculoskeletal:  no cyanosis of digits and no clubbing  PSYCH: alert & oriented x 3, fluent speech NEURO: no focal motor/sensory deficits  LABORATORY DATA:  I have reviewed the data as listed    Latest Ref Rng & Units 07/05/2022    2:37 PM 03/31/2022    3:10 PM 03/02/2022    3:18 PM  CBC  WBC 4.0 - 10.5 K/uL 6.3  6.0  7.0   Hemoglobin 13.0 - 17.0 g/dL 14.9  13.6  14.4   Hematocrit 39.0 - 52.0 %  44.2  41.5  44.3   Platelets 150 - 400 K/uL 191  196  241        Latest Ref Rng & Units 07/05/2022    2:37 PM 03/31/2022    3:10 PM 03/02/2022    3:18 PM  CMP  Glucose 70 - 99 mg/dL 98  84  75   BUN 6 - 20 mg/dL '14  13  10   '$ Creatinine 0.61 - 1.24 mg/dL 0.95  1.32  0.99   Sodium 135 - 145 mmol/L 138  138  144   Potassium 3.5 - 5.1 mmol/L 4.0  3.8  4.4   Chloride 98 - 111 mmol/L 106  109  106   CO2 22 - 32 mmol/L '25  27  24   '$ Calcium 8.9 - 10.3 mg/dL 9.5  8.9  9.0   Total Protein 6.5 - 8.1 g/dL 7.5  6.9  7.1   Total Bilirubin 0.3 - 1.2 mg/dL 0.4  0.6  0.5   Alkaline Phos 38 - 126 U/L 61  51  62   AST 15 - 41 U/L '23  17  20   '$ ALT 0 - 44 U/L '28  23  25     '$ No results found for: "MPROTEIN" No results found for: "KPAFRELGTCHN", "LAMBDASER", "KAPLAMBRATIO"   RADIOGRAPHIC STUDIES: No results found.  ASSESSMENT & PLAN Tommy Walters 51 y.o. male with medical history significant for recurrent lower extremity DVTs who presents for a follow up visit.  He is currently on Tommy Walters 20 mg once a day. He reports having occasional episodes of blood in the stools and he has held Tommy Walters for a short period.He denies any active bleeding currently so recommend to continue Tommy Walters at this time.    #Recurrent DVTs: --1st episode in 2011, felt to be provoked for sedentary lifestyle. Treated with Lovenox x 6 months --2nd episode in 2017, felt to be unprovoked. Treated with Tommy Walters x 6 months.  --3rd episode in 2022, felt to be unprovoked. Started on Eliquis but patient requested to switch to Tommy Walters in July 2023 for  convenience of taking medication once a day.  --Hypercoagulable workup from 10/07/2010 ruled out protein C and S deficiencies, factor 5 leiden mutation, prothrombin gene mutation.  --Elevation noted in beta-2 glycoprotein IgM, however no elevations in anticardiolipin.  As such the patient does not have triple positive APS.  Okay to continue Tommy Walters at this time. Plan: --Will transition to Eliquis 2.5 mg twice daily per patient request.  This is mostly due to the lower cost of Eliquis. --Labs show white blood cell 6.3, hemoglobin 14.9, MCV 82.8, and platelets 191 --RTC in 6 months with labs.    #Intermittent episodes of hematochezia: --Last colonoscopy was on 06/27/2019 which showed several polyps and diverticulosis.  --Discussed symptoms with Dr. Tarri Glenn (gastroenterology), who felt likely cause is due to his known internal hemorrhoids but will follow up with patient to rule out other causes.    No orders of the defined types were placed in this encounter.   All questions were answered. The patient knows to call the clinic with any problems, questions or concerns.  A total of more than 30 minutes were spent on this encounter with face-to-face time and non-face-to-face time, including preparing to see the patient, ordering tests and/or medications, counseling the patient and coordination of care as outlined above.   Tommy Peoples, MD Department of Hematology/Oncology [place] at [facility] Phone: [phone] Pager: [phone] Email:  [email or web address]  07/05/2022 4:19 PM

## 2022-07-05 ENCOUNTER — Inpatient Hospital Stay (HOSPITAL_BASED_OUTPATIENT_CLINIC_OR_DEPARTMENT_OTHER): Payer: BC Managed Care – PPO | Admitting: Hematology and Oncology

## 2022-07-05 ENCOUNTER — Inpatient Hospital Stay: Payer: BC Managed Care – PPO | Attending: Hematology and Oncology

## 2022-07-05 VITALS — BP 140/84 | HR 69 | Temp 97.9°F | Resp 18 | Ht 66.0 in | Wt 226.6 lb

## 2022-07-05 DIAGNOSIS — Z7901 Long term (current) use of anticoagulants: Secondary | ICD-10-CM | POA: Insufficient documentation

## 2022-07-05 DIAGNOSIS — Z86718 Personal history of other venous thrombosis and embolism: Secondary | ICD-10-CM | POA: Diagnosis not present

## 2022-07-05 DIAGNOSIS — I82599 Chronic embolism and thrombosis of other specified deep vein of unspecified lower extremity: Secondary | ICD-10-CM

## 2022-07-05 LAB — CMP (CANCER CENTER ONLY)
ALT: 28 U/L (ref 0–44)
AST: 23 U/L (ref 15–41)
Albumin: 4.4 g/dL (ref 3.5–5.0)
Alkaline Phosphatase: 61 U/L (ref 38–126)
Anion gap: 7 (ref 5–15)
BUN: 14 mg/dL (ref 6–20)
CO2: 25 mmol/L (ref 22–32)
Calcium: 9.5 mg/dL (ref 8.9–10.3)
Chloride: 106 mmol/L (ref 98–111)
Creatinine: 0.95 mg/dL (ref 0.61–1.24)
GFR, Estimated: >60 mL/min (ref >60)
Glucose, Bld: 98 mg/dL (ref 70–99)
Potassium: 4 mmol/L (ref 3.5–5.1)
Sodium: 138 mmol/L (ref 135–145)
Total Bilirubin: 0.4 mg/dL (ref 0.3–1.2)
Total Protein: 7.5 g/dL (ref 6.5–8.1)

## 2022-07-05 LAB — CBC WITH DIFFERENTIAL (CANCER CENTER ONLY)
Abs Immature Granulocytes: 0.02 10*3/uL (ref 0.00–0.07)
Basophils Absolute: 0 10*3/uL (ref 0.0–0.1)
Basophils Relative: 0 %
Eosinophils Absolute: 0.3 10*3/uL (ref 0.0–0.5)
Eosinophils Relative: 5 %
HCT: 44.2 % (ref 39.0–52.0)
Hemoglobin: 14.9 g/dL (ref 13.0–17.0)
Immature Granulocytes: 0 %
Lymphocytes Relative: 35 %
Lymphs Abs: 2.2 10*3/uL (ref 0.7–4.0)
MCH: 27.9 pg (ref 26.0–34.0)
MCHC: 33.7 g/dL (ref 30.0–36.0)
MCV: 82.8 fL (ref 80.0–100.0)
Monocytes Absolute: 0.9 10*3/uL (ref 0.1–1.0)
Monocytes Relative: 14 %
Neutro Abs: 2.8 10*3/uL (ref 1.7–7.7)
Neutrophils Relative %: 46 %
Platelet Count: 191 10*3/uL (ref 150–400)
RBC: 5.34 MIL/uL (ref 4.22–5.81)
RDW: 13 % (ref 11.5–15.5)
WBC Count: 6.3 10*3/uL (ref 4.0–10.5)
nRBC: 0 % (ref 0.0–0.2)

## 2022-07-05 MED ORDER — APIXABAN 2.5 MG PO TABS: 2.5000 mg | ORAL_TABLET | Freq: Two times a day (BID) | ORAL | 2 refills | Status: DC

## 2022-07-18 ENCOUNTER — Encounter: Payer: Self-pay | Admitting: Gastroenterology

## 2022-08-10 NOTE — Telephone Encounter (Signed)
Needs appointment. 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

## 2022-08-22 ENCOUNTER — Ambulatory Visit (INDEPENDENT_AMBULATORY_CARE_PROVIDER_SITE_OTHER): Payer: BC Managed Care – PPO | Admitting: Nurse Practitioner

## 2022-08-22 ENCOUNTER — Encounter: Payer: Self-pay | Admitting: Nurse Practitioner

## 2022-08-22 VITALS — BP 133/70 | HR 74 | Temp 98.0°F | Ht 66.0 in | Wt 226.0 lb

## 2022-08-22 DIAGNOSIS — H938X3 Other specified disorders of ear, bilateral: Secondary | ICD-10-CM | POA: Insufficient documentation

## 2022-08-22 DIAGNOSIS — Z86718 Personal history of other venous thrombosis and embolism: Secondary | ICD-10-CM

## 2022-08-22 NOTE — Assessment & Plan Note (Signed)
Examination of middle and external ears normal.  No drainage, redness, wax bulging noted

## 2022-08-22 NOTE — Progress Notes (Signed)
Acute Office Visit  Subjective:     Patient ID: Tommy Walters, male    DOB: 11-30-1970, 52 y.o.   MRN: [id number]  Chief Complaint  Patient presents with   Ear Fullness    Pt stated--both ears feels like fullness and little pain--2 days    HPI Tommy Walters with past medical history of DVT presents with complaints of bilateral ear fullness that happened yesterday, states that his ears feels better today. he denies ear pain, trouble hearing, ear discharge .  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 Hs had 3 DVT last episode was in 2022. He is currently taking Eliquis 2.5 mg twice daily, followed by hematology. He has not needed the license but would like to have it incase he eventually needs it.      Review of Systems  Constitutional:  Negative for chills, diaphoresis, fever, malaise/fatigue and weight loss.  HENT:  Negative for congestion, ear discharge, ear pain, hearing loss, nosebleeds, sinus pain and sore throat.   Eyes: Negative.   Respiratory: Negative.  Negative for cough, hemoptysis, sputum production, shortness of breath and stridor.   Cardiovascular: Negative.  Negative for chest pain, palpitations, orthopnea and claudication.  Skin:  Negative for itching and rash.  Neurological: Negative.  Negative for dizziness, tingling and headaches.  Psychiatric/Behavioral: Negative.  Negative for depression, hallucinations, substance abuse and suicidal ideas.         Objective:    BP 133/70   Pulse 74   Temp 98 F (36.7 C)   Ht '5\' 6"'$  (1.676 m)   Wt 226 lb (102.5 kg)   SpO2 100%   BMI 36.48 kg/m    Physical Exam Constitutional:      General: He is not in acute distress.    Appearance: Normal appearance. He is not ill-appearing, toxic-appearing or diaphoretic.  HENT:     Right Ear: Tympanic membrane, ear canal and external ear  normal. There is no impacted cerumen.     Left Ear: Tympanic membrane, ear canal and external ear normal. There is no impacted cerumen.     Nose: Nose normal. No congestion or rhinorrhea.     Mouth/Throat:     Mouth: Mucous membranes are moist.     Pharynx: No oropharyngeal exudate or posterior oropharyngeal erythema.  Eyes:     General: No scleral icterus.       Right eye: No discharge.        Left eye: No discharge.     Extraocular Movements: Extraocular movements intact.  Cardiovascular:     Rate and Rhythm: Normal rate and regular rhythm.     Pulses: Normal pulses.     Heart sounds: Normal heart sounds. No murmur heard.    No friction rub. No gallop.  Pulmonary:     Effort: Pulmonary effort is normal. No respiratory distress.     Breath sounds: Normal breath sounds. No stridor. No wheezing, rhonchi or rales.  Chest:     Chest wall: No tenderness.  Abdominal:     General: There is no distension.     Palpations: Abdomen is soft.     Tenderness: There is no abdominal tenderness. There is no guarding.  Musculoskeletal:        General: No swelling, deformity or signs of injury.  Right lower leg: No edema.     Left lower leg: No edema.  Skin:    General: Skin is warm and dry.  Neurological:     Mental Status: He is alert and oriented to person, place, and time.     Cranial Nerves: No cranial nerve deficit.     Sensory: No sensory deficit.     Motor: No weakness.     Coordination: Coordination normal.     Gait: Gait normal.  Psychiatric:        Mood and Affect: Mood normal.        Behavior: Behavior normal.        Thought Content: Thought content normal.        Judgment: Judgment normal.     No results found for any visits on 08/22/22.      Assessment & Plan:   Problem List Items Addressed This Visit       Nervous and Auditory   Sensation of fullness in both ears - Primary    Examination of middle and external ears normal.  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  Maintain close follow up with hematology  Notes from hematology below -1st episode in 2011, felt to be provoked for sedentary lifestyle. Treated with Lovenox x 6 months --2nd episode in 2017, felt to be unprovoked. Treated with Xarelto x 6 months.  --3rd episode in 2022, felt to be unprovoked. Started on Eliquis but patient requested to switch to Xarelto in July 2023 for convenience of taking medication once a day.  --Hypercoagulable workup from 10/07/2010 ruled out protein C and S deficiencies, factor 5 leiden mutation, prothrombin gene mutation.  --Elevation noted in beta-2 glycoprotein IgM, however no elevations in anticardiolipin.  As such the patient does not have triple positive APS.  Okay to continue [place] therapy at this time.       No orders of the defined types were placed in this encounter.   Return in about 6 months (around 02/20/2023) for CPE.  Renee Rival, FNP

## 2022-08-22 NOTE — Assessment & Plan Note (Signed)
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  Maintain close follow up with hematology  Notes from hematology below -1st episode in 2011, felt to be provoked for sedentary lifestyle. Treated with Lovenox x 6 months --2nd episode in 2017, felt to be unprovoked. Treated with Xarelto x 6 months.  --3rd episode in 2022, felt to be unprovoked. Started on Eliquis but patient requested to switch to Xarelto in July 2023 for convenience of taking medication once a day.  --Hypercoagulable workup from 10/07/2010 ruled out protein C and S deficiencies, factor 5 leiden mutation, prothrombin gene mutation.  --Elevation noted in beta-2 glycoprotein IgM, however no elevations in anticardiolipin.  As such the patient does not have triple positive APS.  Okay to continue [place] therapy at this time.

## 2022-08-22 NOTE — Patient Instructions (Signed)

## 2022-09-01 ENCOUNTER — Ambulatory Visit: Payer: BC Managed Care – PPO | Admitting: Nurse Practitioner

## 2022-09-20 ENCOUNTER — Other Ambulatory Visit: Payer: Self-pay

## 2022-09-20 DIAGNOSIS — E785 Hyperlipidemia, unspecified: Secondary | ICD-10-CM

## 2022-09-20 MED ORDER — SIMVASTATIN 40 MG PO TABS: 40.0000 mg | ORAL_TABLET | Freq: Every day | ORAL | 0 refills | Status: DC

## 2023-01-03 ENCOUNTER — Other Ambulatory Visit: Payer: Self-pay | Admitting: Physician Assistant

## 2023-01-03 ENCOUNTER — Inpatient Hospital Stay (HOSPITAL_BASED_OUTPATIENT_CLINIC_OR_DEPARTMENT_OTHER): Payer: BC Managed Care – PPO | Admitting: Hematology and Oncology

## 2023-01-03 ENCOUNTER — Inpatient Hospital Stay: Payer: BC Managed Care – PPO | Attending: Hematology and Oncology

## 2023-01-03 VITALS — BP 138/64 | HR 68 | Temp 98.1°F | Wt 220.8 lb

## 2023-01-03 DIAGNOSIS — I82599 Chronic embolism and thrombosis of other specified deep vein of unspecified lower extremity: Secondary | ICD-10-CM

## 2023-01-03 DIAGNOSIS — Z86718 Personal history of other venous thrombosis and embolism: Secondary | ICD-10-CM | POA: Diagnosis not present

## 2023-01-03 DIAGNOSIS — Z7901 Long term (current) use of anticoagulants: Secondary | ICD-10-CM | POA: Insufficient documentation

## 2023-01-03 LAB — CMP (CANCER CENTER ONLY)
ALT: 31 U/L (ref 0–44)
AST: 25 U/L (ref 15–41)
Albumin: 4.2 g/dL (ref 3.5–5.0)
Alkaline Phosphatase: 56 U/L (ref 38–126)
Anion gap: 5 (ref 5–15)
BUN: 11 mg/dL (ref 6–20)
CO2: 29 mmol/L (ref 22–32)
Calcium: 9.5 mg/dL (ref 8.9–10.3)
Chloride: 104 mmol/L (ref 98–111)
Creatinine: 1 mg/dL (ref 0.61–1.24)
GFR, Estimated: >60 mL/min (ref >60)
Glucose, Bld: 79 mg/dL (ref 70–99)
Potassium: 4.1 mmol/L (ref 3.5–5.1)
Sodium: 138 mmol/L (ref 135–145)
Total Bilirubin: 0.5 mg/dL (ref 0.3–1.2)
Total Protein: 7.6 g/dL (ref 6.5–8.1)

## 2023-01-03 LAB — CBC WITH DIFFERENTIAL (CANCER CENTER ONLY)
Abs Immature Granulocytes: 0.01 10*3/uL (ref 0.00–0.07)
Basophils Absolute: 0 10*3/uL (ref 0.0–0.1)
Basophils Relative: 1 %
Eosinophils Absolute: 0.5 10*3/uL (ref 0.0–0.5)
Eosinophils Relative: 7 %
HCT: 44.6 % (ref 39.0–52.0)
Hemoglobin: 14.5 g/dL (ref 13.0–17.0)
Immature Granulocytes: 0 %
Lymphocytes Relative: 41 %
Lymphs Abs: 2.8 10*3/uL (ref 0.7–4.0)
MCH: 26.9 pg (ref 26.0–34.0)
MCHC: 32.5 g/dL (ref 30.0–36.0)
MCV: 82.7 fL (ref 80.0–100.0)
Monocytes Absolute: 0.6 10*3/uL (ref 0.1–1.0)
Monocytes Relative: 9 %
Neutro Abs: 2.9 10*3/uL (ref 1.7–7.7)
Neutrophils Relative %: 42 %
Platelet Count: 203 10*3/uL (ref 150–400)
RBC: 5.39 MIL/uL (ref 4.22–5.81)
RDW: 13.2 % (ref 11.5–15.5)
WBC Count: 6.9 10*3/uL (ref 4.0–10.5)
nRBC: 0 % (ref 0.0–0.2)

## 2023-01-03 NOTE — Progress Notes (Signed)
[facility] [phone]   [phone]  PROGRESS NOTE  Patient Care Team: Ivonne Andrew, NP as PCP - General (Pulmonary Disease)  Hematological/Oncological History # Recurrent DVTs 1) 2011: DVT in left leg, felt to be provoked from sedentary lifestyle. Treated with Lovenox x 6 months.    2) 02/11/2016: DVT involving gastronemius vein of the right lower extremity coursing from the mid calf to the confluence with but including the popliteal vein. Treated with Xarelto x 6 months.    3) 07/22/2021: Age indeterminate deep vein thrombosis involving the left gastrocnemius veins. Prescribed Eliquis   4) 03/02/2022: Switched to Xarelto per patient preference to take [facility] therapy once a day.    5) 03/31/2022: Establish care with [facility]   Interval History:  Tommy Walters 53 y.o. male with medical history significant for recurrent lower extremity DVTs who presents for a follow up visit. The patient's last visit was on 07/05/2022 at which time he established care. In the interim since the last visit he has continued eliquis therapy.   On exam today Tommy Walters notes he has been well overall in the interim since her last visit.  He notes he is taking his Eliquis 2.5 mg twice daily without any difficulty.  He is not having any trouble with the Eliquis.  He denies any bleeding, bruising, or dark stools.  He notes no blood in the urine or stool.  He notes the cost of the medication is great and currently pays $10 per month.  He is not having any signs or symptoms concerning for blood clot.  He denies any leg pain, leg swelling, chest pain, or shortness of breath.  Overall his appetite is good and his energy levels are strong.  He has lost about 6 pounds in the interim since her last visit.  This has been intentional through exercise.  He is not having any nausea, vomiting, or diarrhea.  He is not having any shortness of breath or cough.  He also denies any fevers,  chills, sweats.  A full 10 point ROS was otherwise negative.  Previously we discussed the risks and benefits of Eliquis vs Xarelto therapy.  He predominately wants to change due to the cost we transitioned him back on Eliquis at maintenance dose 2.5 mg twice daily.  MEDICAL HISTORY:  Past Medical History:  Diagnosis Date   DVT (deep venous thrombosis) (HCC)    History of colon polyps 06/2019   Shortness of breath     SURGICAL HISTORY: Past Surgical History:  Procedure Laterality Date   NO PAST SURGERIES      SOCIAL HISTORY: Social History   Socioeconomic History   Marital status: Married    Spouse name: Rubin Payor   Number of children: 4   Years of education: MS   Highest education level: Not on file  Occupational History   Occupation: Film/video editor   Tobacco Use   Smoking status: Never   Smokeless tobacco: Never  Vaping Use   Vaping Use: Never used  Substance and Sexual Activity   Alcohol use: Not Currently    Alcohol/week: 1.0 standard drink of alcohol    Types: 1 Glasses of wine per week    Comment: occ   Drug use: No   Sexual activity: Yes    Birth control/protection: None  Other Topics Concern   Not on file  Social History Narrative   Lives with wife and 3 daughters.   Son is in school elsewhere.  Social Determinants of Health   Financial Resource Strain: Not on file  Food Insecurity: Not on file  Transportation Needs: Not on file  Physical Activity: Not on file  Stress: Not on file  Social Connections: Not on file  Intimate Partner Violence: Not on file    FAMILY HISTORY: Family History  Problem Relation Age of Onset   Hypertension Mother    Diabetes Mother    Stroke Mother 30       died from    Colon cancer Neg Hx    Colon polyps Neg Hx    Esophageal cancer Neg Hx    Rectal cancer Neg Hx    Stomach cancer Neg Hx     ALLERGIES:  is allergic to aspirin, caffeine, and cherry.  MEDICATIONS:  Current Outpatient Medications  Medication Sig  Dispense Refill   acetaminophen (TYLENOL) 500 MG tablet Take 2 tablets (1,000 mg total) by mouth every 6 (six) hours as needed for mild pain, fever or headache. 30 tablet 5   apixaban (ELIQUIS) 2.5 MG TABS tablet Take 1 tablet (2.5 mg total) by mouth 2 (two) times daily. 180 tablet 2   Cholecalciferol (VITAMIN D3 PO) Take 1 tablet by mouth daily.     Cyanocobalamin (B-12 PO) Take 1 tablet by mouth daily.     Glucosamine Sulfate 1000 MG CAPS Take 1 capsule (1,000 mg total) by mouth 2 (two) times daily. 180 capsule 3   Melatonin-Pyridoxine (MELATIN PO) Take by mouth.     Multiple Vitamin (ONE-A-DAY MENS PO) Take by mouth.     simvastatin (ZOCOR) 40 MG tablet Take 1 tablet (40 mg total) by mouth at bedtime. 90 tablet 0   No current facility-administered medications for this visit.    REVIEW OF SYSTEMS:   Constitutional: ( - ) fevers, ( - )  chills , ( - ) night sweats Eyes: ( - ) blurriness of vision, ( - ) double vision, ( - ) watery eyes Ears, nose, mouth, throat, and face: ( - ) mucositis, ( - ) sore throat Respiratory: ( - ) cough, ( - ) dyspnea, ( - ) wheezes Cardiovascular: ( - ) palpitation, ( - ) chest discomfort, ( - ) lower extremity swelling Gastrointestinal:  ( - ) nausea, ( - ) heartburn, ( - ) change in bowel habits Skin: ( - ) abnormal skin rashes Lymphatics: ( - ) new lymphadenopathy, ( - ) easy bruising Neurological: ( - ) numbness, ( - ) tingling, ( - ) new weaknesses Behavioral/Psych: ( - ) mood change, ( - ) new changes  All other systems were reviewed with the patient and are negative.  PHYSICAL EXAMINATION:  Vitals:   01/03/23 1506  BP: 138/64  Pulse: 68  Temp: 98.1 F (36.7 C)  SpO2: 100%    Filed Weights   01/03/23 1506  Weight: 220 lb 12.8 oz (100.2 kg)     GENERAL: Well-appearing middle-aged African-American male, alert, no distress and comfortable SKIN: skin color, texture, turgor are normal, no rashes or significant lesions EYES: conjunctiva are  pink and non-injected, sclera clear LUNGS: clear to auscultation and percussion with normal breathing effort HEART: regular rate & rhythm and no murmurs and no lower extremity edema Musculoskeletal: no cyanosis of digits and no clubbing  PSYCH: alert & oriented x 3, fluent speech NEURO: no focal motor/sensory deficits  LABORATORY DATA:  I have reviewed the data as listed    Latest Ref Rng & Units 01/03/2023    2:43 PM  07/05/2022    2:37 PM 03/31/2022    3:10 PM  CBC  WBC 4.0 - 10.5 K/uL 6.9  6.3  6.0   Hemoglobin 13.0 - 17.0 g/dL 16.1  09.6  04.5   Hematocrit 39.0 - 52.0 % 44.6  44.2  41.5   Platelets 150 - 400 K/uL 203  191  196        Latest Ref Rng & Units 07/05/2022    2:37 PM 03/31/2022    3:10 PM 03/02/2022    3:18 PM  CMP  Glucose 70 - 99 mg/dL 98  84  75   BUN 6 - 20 mg/dL 14  13  10    Creatinine 0.61 - 1.24 mg/dL 4.09  8.11  9.14   Sodium 135 - 145 mmol/L 138  138  144   Potassium 3.5 - 5.1 mmol/L 4.0  3.8  4.4   Chloride 98 - 111 mmol/L 106  109  106   CO2 22 - 32 mmol/L 25  27  24    Calcium 8.9 - 10.3 mg/dL 9.5  8.9  9.0   Total Protein 6.5 - 8.1 g/dL 7.5  6.9  7.1   Total Bilirubin 0.3 - 1.2 mg/dL 0.4  0.6  0.5   Alkaline Phos 38 - 126 U/L 61  51  62   AST 15 - 41 U/L 23  17  20    ALT 0 - 44 U/L 28  23  25      No results found for: "MPROTEIN" No results found for: "KPAFRELGTCHN", "LAMBDASER", "KAPLAMBRATIO"   RADIOGRAPHIC STUDIES: No results found.  ASSESSMENT & PLAN Tommy Walters 52 y.o. male with medical history significant for recurrent lower extremity DVTs who presents for a follow up visit.   #Recurrent DVTs: --1st episode in 2011, felt to be provoked for sedentary lifestyle. Treated with Lovenox x 6 months --2nd episode in 2017, felt to be unprovoked. Treated with Xarelto x 6 months.  --3rd episode in 2022, felt to be unprovoked. Started on Eliquis but patient requested to switch to Xarelto in July 2023 for convenience of taking medication once  a day.  --Hypercoagulable workup from 10/07/2010 ruled out protein C and S deficiencies, factor 5 leiden mutation, prothrombin gene mutation.  --Elevation noted in beta-2 glycoprotein IgM, however no elevations in anticardiolipin.  As such the patient does not have triple positive APS.  Okay to continue DOAC therapy at this time. Plan: --Will transition to Eliquis 2.5 mg twice daily per patient request.  This is mostly due to the lower cost of Eliquis. --Labs show white blood cell 6.9, hemoglobin 14.5, MCV 82.7, and platelets of 203 --RTC in 6 months with labs.    #Intermittent episodes of Hematochezia: --Last colonoscopy was on 06/27/2019 which showed several polyps and diverticulosis.  --Discussed symptoms with Dr. Orvan Falconer (gastroenterology), who felt likely cause is due to his known internal hemorrhoids but will follow up with patient to rule out other causes.    No orders of the defined types were placed in this encounter.   All questions were answered. The patient knows to call the clinic with any problems, questions or concerns.  A total of more than 30 minutes were spent on this encounter with face-to-face time and non-face-to-face time, including preparing to see the patient, ordering tests and/or medications, counseling the patient and coordination of care as outlined above.   Ulysees Barns, MD Department of Hematology/Oncology [facility] Phone: [phone] Pager: [phone]  Email: [email or web address]  01/03/2023 3:19 PM

## 2023-02-23 ENCOUNTER — Ambulatory Visit: Payer: Self-pay | Admitting: Nurse Practitioner

## 2023-03-02 ENCOUNTER — Encounter: Payer: Self-pay | Admitting: Nurse Practitioner

## 2023-03-02 ENCOUNTER — Ambulatory Visit (INDEPENDENT_AMBULATORY_CARE_PROVIDER_SITE_OTHER): Payer: BC Managed Care – PPO | Admitting: Nurse Practitioner

## 2023-03-02 VITALS — BP 128/69 | HR 57 | Temp 97.4°F | Ht 60.0 in | Wt 222.4 lb

## 2023-03-02 DIAGNOSIS — Z1321 Encounter for screening for nutritional disorder: Secondary | ICD-10-CM

## 2023-03-02 DIAGNOSIS — E785 Hyperlipidemia, unspecified: Secondary | ICD-10-CM

## 2023-03-02 DIAGNOSIS — Z1329 Encounter for screening for other suspected endocrine disorder: Secondary | ICD-10-CM | POA: Diagnosis not present

## 2023-03-02 DIAGNOSIS — Z0000 Encounter for general adult medical examination without abnormal findings: Secondary | ICD-10-CM

## 2023-03-02 DIAGNOSIS — Z130 Encounter for screening for diseases of the blood and blood-forming organs and certain disorders involving the immune mechanism: Secondary | ICD-10-CM

## 2023-03-02 DIAGNOSIS — E538 Deficiency of other specified B group vitamins: Secondary | ICD-10-CM

## 2023-03-02 DIAGNOSIS — Z13228 Encounter for screening for other metabolic disorders: Secondary | ICD-10-CM

## 2023-03-02 NOTE — Assessment & Plan Note (Signed)
Lab Results  Component Value Date   CHOL 133 03/02/2022   HDL 44 03/02/2022   LDLCALC 77 03/02/2022   TRIG 54 03/02/2022   CHOLHDL 3.0 03/02/2022   On simvastatin 40mg  daily  Checking labs

## 2023-03-02 NOTE — Progress Notes (Signed)
Complete physical exam  Patient: Tommy Walters   DOB: 1971/02/12   52 y.o. Male  MRN: [id number]  Subjective:    Chief Complaint  Patient presents with   Annual Exam    Tommy Walters is a 52 y.o. male  has a past medical history of DVT (deep venous thrombosis) (HCC), History of colon polyps (06/2019), and Shortness of breath. who presents today for a complete physical exam. He reports consuming a general diet. Excercises on the threadmil 4 days a week.  He generally feels well. He reports sleeping fairly well. He does not have additional problems to discuss today.    Has got 2 shingles vaccine. Will request records from his pharmacy   He denies adverse reactions to current medications.   Most recent fall risk assessment:    03/02/2023    8:01 AM  Fall Risk   Falls in the past year? 0  Number falls in past yr: 0  Injury with Fall? 0  Risk for fall due to : No Fall Risks  Follow up Falls evaluation completed     Most recent depression screenings:    03/02/2023    8:01 AM 08/22/2022    9:03 AM  PHQ 2/9 Scores  PHQ - 2 Score 0 0        Patient Care Team: Donell Beers, FNP as PCP - General (Nurse Practitioner)   Outpatient Medications Prior to Visit  Medication Sig   acetaminophen (TYLENOL) 500 MG tablet Take 2 tablets (1,000 mg total) by mouth every 6 (six) hours as needed for mild pain, fever or headache.   apixaban (ELIQUIS) 2.5 MG TABS tablet Take 1 tablet (2.5 mg total) by mouth 2 (two) times daily.   Cholecalciferol (VITAMIN D3 PO) Take 1 tablet by mouth daily.   Glucosamine Sulfate 1000 MG CAPS Take 1 capsule (1,000 mg total) by mouth 2 (two) times daily.   Melatonin-Pyridoxine (MELATIN PO) Take by mouth.   Multiple Vitamin (ONE-A-DAY MENS PO) Take by mouth.   simvastatin (ZOCOR) 40 MG tablet Take 1 tablet (40 mg total) by mouth at bedtime.   Cyanocobalamin (B-12 PO) Take 1 tablet by mouth daily. (Patient not taking: Reported on 03/02/2023)   No  facility-administered medications prior to visit.    Review of Systems  Constitutional:  Negative for activity change, appetite change, chills, diaphoresis, fatigue, fever and unexpected weight change.  HENT:  Negative for congestion, dental problem, drooling and ear discharge.   Eyes:  Negative for pain, discharge, redness and itching.  Respiratory:  Negative for apnea, cough, choking, chest tightness, shortness of breath and wheezing.   Cardiovascular: Negative.  Negative for chest pain, palpitations and leg swelling.  Gastrointestinal:  Negative for abdominal distention, abdominal pain, anal bleeding, blood in stool, constipation, diarrhea and vomiting.  Endocrine: Negative for polydipsia, polyphagia and polyuria.  Genitourinary:  Negative for difficulty urinating, flank pain, frequency and genital sores.  Musculoskeletal: Negative.  Negative for arthralgias, back pain, gait problem and joint swelling.  Skin:  Negative for color change, pallor and rash.  Neurological:  Negative for dizziness, facial asymmetry, light-headedness, numbness and headaches.  Psychiatric/Behavioral:  Negative for agitation, behavioral problems, confusion, hallucinations, self-injury, sleep disturbance and suicidal ideas.        Objective:     BP 128/69   Pulse (!) 57   Temp (!) 97.4 F (36.3 C)   Ht 5' (1.524 m)   Wt 222 lb 6.4 oz (100.9 kg)   SpO2  100%   BMI 43.43 kg/m    Physical Exam Vitals and nursing note reviewed. Exam conducted with a chaperone present.  Constitutional:      General: He is not in acute distress.    Appearance: Normal appearance. He is obese. He is not ill-appearing, toxic-appearing or diaphoretic.  HENT:     Right Ear: Tympanic membrane, ear canal and external ear normal. There is no impacted cerumen.     Left Ear: Tympanic membrane, ear canal and external ear normal. There is no impacted cerumen.     Nose: Nose normal. No congestion or rhinorrhea.     Mouth/Throat:      Mouth: Mucous membranes are moist.     Pharynx: Oropharynx is clear. No oropharyngeal exudate or posterior oropharyngeal erythema.  Eyes:     General: No scleral icterus.       Right eye: No discharge.        Left eye: No discharge.     Extraocular Movements: Extraocular movements intact.     Conjunctiva/sclera: Conjunctivae normal.  Neck:     Vascular: No carotid bruit.  Cardiovascular:     Rate and Rhythm: Normal rate and regular rhythm.     Pulses: Normal pulses.     Heart sounds: Normal heart sounds. No murmur heard.    No friction rub. No gallop.  Pulmonary:     Effort: Pulmonary effort is normal. No respiratory distress.     Breath sounds: Normal breath sounds. No stridor. No wheezing, rhonchi or rales.  Chest:     Chest wall: No tenderness.  Abdominal:     General: Bowel sounds are normal. There is no distension.     Palpations: Abdomen is soft. There is no mass.     Tenderness: There is no abdominal tenderness. There is no right CVA tenderness, left CVA tenderness, guarding or rebound.     Hernia: No hernia is present.  Musculoskeletal:        General: No swelling, tenderness, deformity or signs of injury.     Cervical back: Normal range of motion and neck supple. No rigidity or tenderness.     Right lower leg: No edema.     Left lower leg: No edema.  Lymphadenopathy:     Cervical: No cervical adenopathy.  Skin:    General: Skin is warm and dry.     Capillary Refill: Capillary refill takes less than 2 seconds.     Coloration: Skin is not jaundiced or pale.     Findings: No bruising, erythema, lesion or rash.  Neurological:     Mental Status: He is alert and oriented to person, place, and time.     Cranial Nerves: No cranial nerve deficit.     Sensory: No sensory deficit.     Motor: No weakness.     Coordination: Coordination normal.     Gait: Gait normal.     Deep Tendon Reflexes: Reflexes normal.  Psychiatric:        Mood and Affect: Mood normal.         Behavior: Behavior normal.        Thought Content: Thought content normal.        Judgment: Judgment normal.     No results found for any visits on 03/02/23.     Assessment & Plan:    Routine Health Maintenance and Physical Exam  Immunization History  Administered Date(s) Administered   Influenza Split 07/05/2012   Influenza, Seasonal, Injecte, Preservative Fre 06/28/2014  Influenza,inj,Quad PF,6+ Mos 06/14/2016, 05/18/2017, 06/21/2018, 05/14/2019, 05/04/2020, 05/07/2021, 04/18/2022   Influenza,trivalent, recombinat, inj, PF 07/05/2012   Influenza-Unspecified 06/28/2014, 06/28/2014   Janssen (J&J) SARS-COV-2 Vaccination 11/04/2019   Moderna Sars-Covid-2 Vaccination 05/21/2022   PFIZER(Purple Top)SARS-COV-2 Vaccination 07/03/2020, 04/30/2021   Tdap 08/03/2005, 07/05/2012, 07/17/2016    Health Maintenance  Topic Date Due   Hepatitis C Screening  Never done   Zoster Vaccines- Shingrix (1 of 2) Never done   COVID-19 Vaccine (5 - 2023-24 season) 07/16/2022   INFLUENZA VACCINE  03/15/2023   Colonoscopy  06/30/2025   DTaP/Tdap/Td (4 - Td or Tdap) 07/17/2026   HIV Screening  Completed   HPV VACCINES  Aged Out    Discussed health benefits of physical activity, and encouraged him to engage in regular exercise appropriate for his age and condition.  Problem List Items Addressed This Visit       Other   Hyperlipidemia    Lab Results  Component Value Date   CHOL 133 03/02/2022   HDL 44 03/02/2022   LDLCALC 77 03/02/2022   TRIG 54 03/02/2022   CHOLHDL 3.0 03/02/2022   On simvastatin 40mg  daily  Checking labs      Annual physical exam - Primary    Annual exam as documented.  Counseling done include healthy lifestyle involving committing to 150 minutes of exercise per week, heart healthy diet, and attaining healthy weight. The importance of adequate sleep also discussed.  Regular use of seat belt and home safety were also discussed . Changes in health habits are decided  on by patient with goals and time frames set for achieving them. Immunization and cancer screening  needs are specifically addressed at this visit.         Screening for endocrine, nutritional, metabolic and immunity disorder   Relevant Orders   Lipid panel   PSA   TSH   Hepatitis C antibody   Other Visit Diagnoses     Vitamin B 12 deficiency       Relevant Orders   Vitamin B12      Return in about 1 year (around 03/01/2024) for CPE.     Donell Beers, FNP

## 2023-03-02 NOTE — Patient Instructions (Signed)

## 2023-03-02 NOTE — Assessment & Plan Note (Signed)
Annual exam as documented.  ?Counseling done include healthy lifestyle involving committing to 150 minutes of exercise per week, heart healthy diet, and attaining healthy weight. The importance of adequate sleep also discussed.  ?Regular use of seat belt and home safety were also discussed . ?Changes in health habits are decided on by patient with goals and time frames set for achieving them. ?Immunization and cancer screening  needs are specifically addressed at this visit.   ?

## 2023-03-03 LAB — PSA: Prostate Specific Ag, Serum: 0.9 ng/mL (ref 0.0–4.0)

## 2023-03-03 LAB — LIPID PANEL
Chol/HDL Ratio: 2.8 ratio (ref 0.0–5.0)
Cholesterol, Total: 138 mg/dL (ref 100–199)
HDL: 49 mg/dL (ref >39)
LDL Chol Calc (NIH): 76 mg/dL (ref 0–99)
Triglycerides: 62 mg/dL (ref 0–149)
VLDL Cholesterol Cal: 13 mg/dL (ref 5–40)

## 2023-03-03 LAB — HEPATITIS C ANTIBODY: Hep C Virus Ab: NONREACTIVE

## 2023-03-03 LAB — VITAMIN B12: Vitamin B-12: >2000 pg/mL — ABNORMAL HIGH (ref 232–1245)

## 2023-03-03 LAB — TSH: TSH: 1.04 u[IU]/mL (ref 0.450–4.500)

## 2023-03-20 ENCOUNTER — Other Ambulatory Visit: Payer: Self-pay

## 2023-03-20 DIAGNOSIS — E785 Hyperlipidemia, unspecified: Secondary | ICD-10-CM

## 2023-03-20 MED ORDER — SIMVASTATIN 40 MG PO TABS
40.0000 mg | ORAL_TABLET | Freq: Every day | ORAL | 0 refills | Status: DC
Start: 2023-03-20 — End: 2023-06-11

## 2023-03-27 ENCOUNTER — Other Ambulatory Visit: Payer: Self-pay | Admitting: Hematology and Oncology

## 2023-06-11 ENCOUNTER — Other Ambulatory Visit: Payer: Self-pay | Admitting: Nurse Practitioner

## 2023-06-11 DIAGNOSIS — E785 Hyperlipidemia, unspecified: Secondary | ICD-10-CM

## 2023-06-11 MED ORDER — SIMVASTATIN 40 MG PO TABS
40.0000 mg | ORAL_TABLET | Freq: Every day | ORAL | 1 refills | Status: DC
Start: 2023-06-11 — End: 2023-11-05

## 2023-07-04 ENCOUNTER — Inpatient Hospital Stay: Payer: 59 | Admitting: Hematology and Oncology

## 2023-07-04 ENCOUNTER — Inpatient Hospital Stay: Payer: 59 | Attending: Hematology and Oncology

## 2023-07-04 ENCOUNTER — Other Ambulatory Visit: Payer: Self-pay | Admitting: Hematology and Oncology

## 2023-07-04 DIAGNOSIS — Z86718 Personal history of other venous thrombosis and embolism: Secondary | ICD-10-CM | POA: Insufficient documentation

## 2023-07-04 DIAGNOSIS — K921 Melena: Secondary | ICD-10-CM | POA: Insufficient documentation

## 2023-07-04 DIAGNOSIS — Z7901 Long term (current) use of anticoagulants: Secondary | ICD-10-CM | POA: Insufficient documentation

## 2023-07-04 DIAGNOSIS — I82599 Chronic embolism and thrombosis of other specified deep vein of unspecified lower extremity: Secondary | ICD-10-CM

## 2023-07-04 NOTE — Progress Notes (Signed)
[facility] [phone]   Fax:([phone]  PROGRESS NOTE  Patient Care Team: Donell Beers, FNP as PCP - General (Nurse Practitioner)  Hematological/Oncological History # Recurrent DVTs 1) 2011: DVT in left leg, felt to be provoked from sedentary lifestyle. Treated with Lovenox x 6 months.    2) 02/11/2016: DVT involving gastronemius vein of the right lower extremity coursing from the mid calf to the confluence with but including the popliteal vein. Treated with Xarelto x 6 months.    3) 07/22/2021: Age indeterminate deep vein thrombosis involving the left gastrocnemius veins. Prescribed Eliquis   4) 03/02/2022: Switched to Xarelto per patient preference to take [facility] therapy once a day.    5) 03/31/2022: Establish care with [facility] Hematology   Interval History:  Tommy Walters 52 y.o. male with medical history significant for recurrent lower extremity DVTs who presents for a follow up visit. The patient's last visit was on 01/03/2023. In the interim since the last visit he has continued eliquis therapy.   On exam today Mr. Pirolli notes ***  He is not having any nausea, vomiting, or diarrhea.  He is not having any shortness of breath or cough.  He also denies any fevers, chills, sweats.  A full 10 point ROS was otherwise negative.   MEDICAL HISTORY:  Past Medical History:  Diagnosis Date   DVT (deep venous thrombosis) (HCC)    History of colon polyps 06/2019   Shortness of breath     SURGICAL HISTORY: Past Surgical History:  Procedure Laterality Date   NO PAST SURGERIES      SOCIAL HISTORY: Social History   Socioeconomic History   Marital status: Married    Spouse name: Rubin Payor   Number of children: 4   Years of education: MS   Highest education level: Master's degree (e.g., MA, MS, MEng, MEd, MSW, MBA)  Occupational History   Occupation: Film/video editor   Tobacco Use   Smoking status: Never   Smokeless tobacco: Never  Vaping Use    Vaping status: Never Used  Substance and Sexual Activity   Alcohol use: Not Currently    Alcohol/week: 1.0 standard drink of alcohol    Types: 1 Glasses of wine per week    Comment: occ   Drug use: No   Sexual activity: Yes    Birth control/protection: None  Other Topics Concern   Not on file  Social History Narrative   Lives with wife and 3 daughters.   Son is in school elsewhere.   Social Determinants of Health   Financial Resource Strain: Medium Risk (02/27/2023)   Overall Financial Resource Strain (CARDIA)    Difficulty of Paying Living Expenses: Somewhat hard  Food Insecurity: Food Insecurity Present (02/27/2023)   Hunger Vital Sign    Worried About Running Out of Food in the Last Year: Sometimes true    Ran Out of Food in the Last Year: Sometimes true  Transportation Needs: No Transportation Needs (02/27/2023)   PRAPARE - Administrator, Civil Service (Medical): No    Lack of Transportation (Non-Medical): No  Physical Activity: Sufficiently Active (02/27/2023)   Exercise Vital Sign    Days of Exercise per Week: 4 days    Minutes of Exercise per Session: 50 min  Stress: No Stress Concern Present (02/27/2023)   [employer] of Occupational Health - Occupational Stress Questionnaire    Feeling of Stress : Not at all  Social Connections: Socially Integrated (02/27/2023)   Social  Connection and Isolation Panel [NHANES]    Frequency of Communication with Friends and Family: More than three times a week    Frequency of Social Gatherings with Friends and Family: More than three times a week    Attends Religious Services: More than 4 times per year    Active Member of [employer] or Organizations: Yes    Attends Engineer, structural: More than 4 times per year    Marital Status: Living with partner  Intimate Partner Violence: Not on file    FAMILY HISTORY: Family History  Problem Relation Age of Onset   Hypertension Mother    Diabetes Mother    Stroke  Mother 74       died from    Colon cancer Neg Hx    Colon polyps Neg Hx    Esophageal cancer Neg Hx    Rectal cancer Neg Hx    Stomach cancer Neg Hx     ALLERGIES:  is allergic to aspirin, caffeine, and cherry.  MEDICATIONS:  Current Outpatient Medications  Medication Sig Dispense Refill   acetaminophen (TYLENOL) 500 MG tablet Take 2 tablets (1,000 mg total) by mouth every 6 (six) hours as needed for mild pain, fever or headache. 30 tablet 5   Cholecalciferol (VITAMIN D3 PO) Take 1 tablet by mouth daily.     Cyanocobalamin (B-12 PO) Take 1 tablet by mouth daily. (Patient not taking: Reported on 03/02/2023)     ELIQUIS 2.5 MG TABS tablet Take 1 tablet by mouth twice daily 180 tablet 0   Glucosamine Sulfate 1000 MG CAPS Take 1 capsule (1,000 mg total) by mouth 2 (two) times daily. 180 capsule 3   Melatonin-Pyridoxine (MELATIN PO) Take by mouth.     Multiple Vitamin (ONE-A-DAY MENS PO) Take by mouth.     simvastatin (ZOCOR) 40 MG tablet Take 1 tablet (40 mg total) by mouth at bedtime. 90 tablet 1   No current facility-administered medications for this visit.    REVIEW OF SYSTEMS:   Constitutional: ( - ) fevers, ( - )  chills , ( - ) night sweats Eyes: ( - ) blurriness of vision, ( - ) double vision, ( - ) watery eyes Ears, nose, mouth, throat, and face: ( - ) mucositis, ( - ) sore throat Respiratory: ( - ) cough, ( - ) dyspnea, ( - ) wheezes Cardiovascular: ( - ) palpitation, ( - ) chest discomfort, ( - ) lower extremity swelling Gastrointestinal:  ( - ) nausea, ( - ) heartburn, ( - ) change in bowel habits Skin: ( - ) abnormal skin rashes Lymphatics: ( - ) new lymphadenopathy, ( - ) easy bruising Neurological: ( - ) numbness, ( - ) tingling, ( - ) new weaknesses Behavioral/Psych: ( - ) mood change, ( - ) new changes  All other systems were reviewed with the patient and are negative.  PHYSICAL EXAMINATION:  There were no vitals filed for this visit.   There were no vitals  filed for this visit.    GENERAL: Well-appearing middle-aged African-American male, alert, no distress and comfortable SKIN: skin color, texture, turgor are normal, no rashes or significant lesions EYES: conjunctiva are pink and non-injected, sclera clear LUNGS: clear to auscultation and percussion with normal breathing effort HEART: regular rate & rhythm and no murmurs and no lower extremity edema Musculoskeletal: no cyanosis of digits and no clubbing  PSYCH: alert & oriented x 3, fluent speech NEURO: no focal motor/sensory deficits  LABORATORY DATA:  I have reviewed the data as listed    Latest Ref Rng & Units 01/03/2023    2:43 PM 07/05/2022    2:37 PM 03/31/2022    3:10 PM  CBC  WBC 4.0 - 10.5 K/uL 6.9  6.3  6.0   Hemoglobin 13.0 - 17.0 g/dL 41.6  60.6  30.1   Hematocrit 39.0 - 52.0 % 44.6  44.2  41.5   Platelets 150 - 400 K/uL 203  191  196        Latest Ref Rng & Units 01/03/2023    2:43 PM 07/05/2022    2:37 PM 03/31/2022    3:10 PM  CMP  Glucose 70 - 99 mg/dL 79  98  84   BUN 6 - 20 mg/dL 11  14  13    Creatinine 0.61 - 1.24 mg/dL 6.01  0.93  2.35   Sodium 135 - 145 mmol/L 138  138  138   Potassium 3.5 - 5.1 mmol/L 4.1  4.0  3.8   Chloride 98 - 111 mmol/L 104  106  109   CO2 22 - 32 mmol/L 29  25  27    Calcium 8.9 - 10.3 mg/dL 9.5  9.5  8.9   Total Protein 6.5 - 8.1 g/dL 7.6  7.5  6.9   Total Bilirubin 0.3 - 1.2 mg/dL 0.5  0.4  0.6   Alkaline Phos 38 - 126 U/L 56  61  51   AST 15 - 41 U/L 25  23  17    ALT 0 - 44 U/L 31  28  23      No results found for: "MPROTEIN" No results found for: "KPAFRELGTCHN", "LAMBDASER", "KAPLAMBRATIO"   RADIOGRAPHIC STUDIES: No results found.  ASSESSMENT & PLAN Sy P Jeanbaptiste 52 y.o. male with medical history significant for recurrent lower extremity DVTs who presents for a follow up visit.   #Recurrent DVTs: --1st episode in 2011, felt to be provoked for sedentary lifestyle. Treated with Lovenox x 6 months --2nd episode in  2017, felt to be unprovoked. Treated with Xarelto x 6 months.  --3rd episode in 2022, felt to be unprovoked. Started on Eliquis but patient requested to switch to Xarelto in July 2023 for convenience of taking medication once a day.  --Hypercoagulable workup from 10/07/2010 ruled out protein C and S deficiencies, factor 5 leiden mutation, prothrombin gene mutation.  --Elevation noted in beta-2 glycoprotein IgM, however no elevations in anticardiolipin.  As such the patient does not have triple positive APS.  Okay to continue DOAC therapy at this time. Plan: --continue Eliquis 2.5 mg twice daily  --no signs or symptoms concerning for recurrent VTE.  --Labs show white blood cell *** --RTC in 6 months with labs.    #Intermittent episodes of Hematochezia: --Last colonoscopy was on 06/27/2019 which showed several polyps and diverticulosis.  --Discussed symptoms with Dr. Orvan Falconer (gastroenterology), who felt likely cause is due to his known internal hemorrhoids but will follow up with patient to rule out other causes.    No orders of the defined types were placed in this encounter.   All questions were answered. The patient knows to call the clinic with any problems, questions or concerns.  A total of more than 30 minutes were spent on this encounter with face-to-face time and non-face-to-face time, including preparing to see the patient, ordering tests and/or medications, counseling the patient and coordination of care as outlined above.   Ulysees Barns, MD Department of Hematology/Oncology [facility] at  [facility] Phone: [phone] Pager: [phone] Email: [email or web address]  07/04/2023 1:14 PM

## 2023-07-05 ENCOUNTER — Inpatient Hospital Stay: Payer: 59 | Admitting: Hematology and Oncology

## 2023-07-05 ENCOUNTER — Inpatient Hospital Stay: Payer: 59

## 2023-07-05 VITALS — BP 142/83 | HR 63 | Temp 100.3°F | Resp 18 | Wt 217.9 lb

## 2023-07-05 DIAGNOSIS — I82599 Chronic embolism and thrombosis of other specified deep vein of unspecified lower extremity: Secondary | ICD-10-CM | POA: Diagnosis not present

## 2023-07-05 DIAGNOSIS — K921 Melena: Secondary | ICD-10-CM | POA: Diagnosis not present

## 2023-07-05 DIAGNOSIS — Z7901 Long term (current) use of anticoagulants: Secondary | ICD-10-CM | POA: Diagnosis not present

## 2023-07-05 DIAGNOSIS — Z86718 Personal history of other venous thrombosis and embolism: Secondary | ICD-10-CM | POA: Diagnosis present

## 2023-07-05 LAB — CBC WITH DIFFERENTIAL (CANCER CENTER ONLY)
Abs Immature Granulocytes: 0.03 10*3/uL (ref 0.00–0.07)
Basophils Absolute: 0.1 10*3/uL (ref 0.0–0.1)
Basophils Relative: 1 %
Eosinophils Absolute: 0.4 10*3/uL (ref 0.0–0.5)
Eosinophils Relative: 6 %
HCT: 45.3 % (ref 39.0–52.0)
Hemoglobin: 14.9 g/dL (ref 13.0–17.0)
Immature Granulocytes: 0 %
Lymphocytes Relative: 42 %
Lymphs Abs: 3.2 10*3/uL (ref 0.7–4.0)
MCH: 27.3 pg (ref 26.0–34.0)
MCHC: 32.9 g/dL (ref 30.0–36.0)
MCV: 83.1 fL (ref 80.0–100.0)
Monocytes Absolute: 0.5 10*3/uL (ref 0.1–1.0)
Monocytes Relative: 7 %
Neutro Abs: 3.3 10*3/uL (ref 1.7–7.7)
Neutrophils Relative %: 44 %
Platelet Count: 210 10*3/uL (ref 150–400)
RBC: 5.45 MIL/uL (ref 4.22–5.81)
RDW: 13.2 % (ref 11.5–15.5)
WBC Count: 7.5 10*3/uL (ref 4.0–10.5)
nRBC: 0 % (ref 0.0–0.2)

## 2023-07-05 LAB — CMP (CANCER CENTER ONLY)
ALT: 36 U/L (ref 0–44)
AST: 26 U/L (ref 15–41)
Albumin: 4 g/dL (ref 3.5–5.0)
Alkaline Phosphatase: 55 U/L (ref 38–126)
Anion gap: 6 (ref 5–15)
BUN: 9 mg/dL (ref 6–20)
CO2: 27 mmol/L (ref 22–32)
Calcium: 9.5 mg/dL (ref 8.9–10.3)
Chloride: 105 mmol/L (ref 98–111)
Creatinine: 1.01 mg/dL (ref 0.61–1.24)
GFR, Estimated: >60 mL/min (ref >60)
Glucose, Bld: 87 mg/dL (ref 70–99)
Potassium: 4.2 mmol/L (ref 3.5–5.1)
Sodium: 138 mmol/L (ref 135–145)
Total Bilirubin: 0.5 mg/dL (ref <1.2)
Total Protein: 7.5 g/dL (ref 6.5–8.1)

## 2023-07-05 NOTE — Progress Notes (Signed)
[facility] [phone]   Fax:([phone]  PROGRESS NOTE  Patient Care Team: Tommy Beers, FNP as PCP - General (Nurse Practitioner)  Hematological/Oncological History # Recurrent DVTs 1) 2011: DVT in left leg, felt to be provoked from sedentary lifestyle. Treated with Lovenox x 6 months.    2) 02/11/2016: DVT involving gastronemius vein of the right lower extremity coursing from the mid calf to the confluence with but including the popliteal vein. Treated with Xarelto x 6 months.    3) 07/22/2021: Age indeterminate deep vein thrombosis involving the left gastrocnemius veins. Prescribed Eliquis   4) 03/02/2022: Switched to Xarelto per patient preference to take [facility] therapy once a day.    5) 03/31/2022: Establish care with [facility] Hematology   Interval History:  Tommy Walters 52 y.o. male with medical history significant for recurrent lower extremity DVTs who presents for a follow up visit. The patient's last visit was on 01/03/2023. In the interim since the last visit he has continued eliquis therapy.   On exam today [name] notes he has been well overall with the interim since her last visit.  He is had no emergency room visits or hospitalizations.  He reports that he was given antibiotics briefly for an eye infection where his eye turned "bloody".  He notes that that is now resolved.  He reports he has not otherwise had any bleeding, bruising, or dark stools.  He reports that the medication is very reasonably priced at $10 per month.  He does not have any signs or symptoms concerning for recurrent VTE such as leg pain, leg swelling, chest pain, or shortness of breath.  He does have some occasional rash on his neck and is unsure if that is associated with the blood thinner.  He reports that he looks forward to Thanksgiving where his daughter is traveling in from [place].  His daughter is in the [employer] and we driving down.  He reports his weight is  down about 5 pounds this is intentional weight loss with diet and exercise.  Overall he feels well and has no questions concerns or complaints today.  He is not having any shortness of breath or cough.  He also denies any fevers, chills, sweats.  A full 10 point ROS was otherwise negative.  MEDICAL HISTORY:  Past Medical History:  Diagnosis Date   DVT (deep venous thrombosis) (HCC)    History of colon polyps 06/2019   Shortness of breath     SURGICAL HISTORY: Past Surgical History:  Procedure Laterality Date   NO PAST SURGERIES      SOCIAL HISTORY: Social History   Socioeconomic History   Marital status: Married    Spouse name: Tommy Walters   Number of children: 4   Years of education: MS   Highest education level: Master's degree (e.g., MA, MS, MEng, MEd, MSW, MBA)  Occupational History   Occupation: Film/video editor   Tobacco Use   Smoking status: Never   Smokeless tobacco: Never  Vaping Use   Vaping status: Never Used  Substance and Sexual Activity   Alcohol use: Not Currently    Alcohol/week: 1.0 standard drink of alcohol    Types: 1 Glasses of wine per week    Comment: occ   Drug use: No   Sexual activity: Yes    Birth control/protection: None  Other Topics Concern   Not on file  Social History Narrative   Lives with wife and 3 daughters.   Son is  in school elsewhere.   Social Determinants of Health   Financial Resource Strain: Medium Risk (02/27/2023)   Overall Financial Resource Strain (CARDIA)    Difficulty of Paying Living Expenses: Somewhat hard  Food Insecurity: Food Insecurity Present (02/27/2023)   Hunger Vital Sign    Worried About Running Out of Food in the Last Year: Sometimes true    Ran Out of Food in the Last Year: Sometimes true  Transportation Needs: No Transportation Needs (02/27/2023)   PRAPARE - Administrator, Civil Service (Medical): No    Lack of Transportation (Non-Medical): No  Physical Activity: Sufficiently Active (02/27/2023)    Exercise Vital Sign    Days of Exercise per Week: 4 days    Minutes of Exercise per Session: 50 min  Stress: No Stress Concern Present (02/27/2023)   [employer] of Occupational Health - Occupational Stress Questionnaire    Feeling of Stress : Not at all  Social Connections: Socially Integrated (02/27/2023)   Social Connection and Isolation Panel [NHANES]    Frequency of Communication with Friends and Family: More than three times a week    Frequency of Social Gatherings with Friends and Family: More than three times a week    Attends Religious Services: More than 4 times per year    Active Member of [employer] or Organizations: Yes    Attends Engineer, structural: More than 4 times per year    Marital Status: Living with partner  Intimate Partner Violence: Not on file    FAMILY HISTORY: Family History  Problem Relation Age of Onset   Hypertension Mother    Diabetes Mother    Stroke Mother 89       died from    Colon cancer Neg Hx    Colon polyps Neg Hx    Esophageal cancer Neg Hx    Rectal cancer Neg Hx    Stomach cancer Neg Hx     ALLERGIES:  is allergic to aspirin, caffeine, and cherry.  MEDICATIONS:  Current Outpatient Medications  Medication Sig Dispense Refill   acetaminophen (TYLENOL) 500 MG tablet Take 2 tablets (1,000 mg total) by mouth every 6 (six) hours as needed for mild pain, fever or headache. 30 tablet 5   Cholecalciferol (VITAMIN D3 PO) Take 1 tablet by mouth daily.     Cyanocobalamin (B-12 PO) Take 1 tablet by mouth daily. (Patient not taking: Reported on 03/02/2023)     ELIQUIS 2.5 MG TABS tablet Take 1 tablet by mouth twice daily 180 tablet 0   Glucosamine Sulfate 1000 MG CAPS Take 1 capsule (1,000 mg total) by mouth 2 (two) times daily. 180 capsule 3   Melatonin-Pyridoxine (MELATIN PO) Take by mouth.     Multiple Vitamin (ONE-A-DAY MENS PO) Take by mouth.     simvastatin (ZOCOR) 40 MG tablet Take 1 tablet (40 mg total) by mouth at bedtime. 90  tablet 1   No current facility-administered medications for this visit.    REVIEW OF SYSTEMS:   Constitutional: ( - ) fevers, ( - )  chills , ( - ) night sweats Eyes: ( - ) blurriness of vision, ( - ) double vision, ( - ) watery eyes Ears, nose, mouth, throat, and face: ( - ) mucositis, ( - ) sore throat Respiratory: ( - ) cough, ( - ) dyspnea, ( - ) wheezes Cardiovascular: ( - ) palpitation, ( - ) chest discomfort, ( - ) lower extremity swelling Gastrointestinal:  ( - ) nausea, ( - )  heartburn, ( - ) change in bowel habits Skin: ( - ) abnormal skin rashes Lymphatics: ( - ) new lymphadenopathy, ( - ) easy bruising Neurological: ( - ) numbness, ( - ) tingling, ( - ) new weaknesses Behavioral/Psych: ( - ) mood change, ( - ) new changes  All other systems were reviewed with the patient and are negative.  PHYSICAL EXAMINATION:  Vitals:   07/05/23 1531  BP: (!) 142/83  Pulse: 63  Resp: 18  Temp: 100.3 F (37.9 C)  SpO2: 96%     Filed Weights   07/05/23 1531  Weight: 217 lb 14.4 oz (98.8 kg)      GENERAL: Well-appearing middle-aged African-American male, alert, no distress and comfortable SKIN: skin color, texture, turgor are normal, no rashes or significant lesions EYES: conjunctiva are pink and non-injected, sclera clear LUNGS: clear to auscultation and percussion with normal breathing effort HEART: regular rate & rhythm and no murmurs and no lower extremity edema Musculoskeletal: no cyanosis of digits and no clubbing  PSYCH: alert & oriented x 3, fluent speech NEURO: no focal motor/sensory deficits  LABORATORY DATA:  I have reviewed the data as listed    Latest Ref Rng & Units 07/05/2023    3:10 PM 01/03/2023    2:43 PM 07/05/2022    2:37 PM  CBC  WBC 4.0 - 10.5 K/uL 7.5  6.9  6.3   Hemoglobin 13.0 - 17.0 g/dL 30.8  65.7  84.6   Hematocrit 39.0 - 52.0 % 45.3  44.6  44.2   Platelets 150 - 400 K/uL 210  203  191        Latest Ref Rng & Units 07/05/2023    3:10  PM 01/03/2023    2:43 PM 07/05/2022    2:37 PM  CMP  Glucose 70 - 99 mg/dL 87  79  98   BUN 6 - 20 mg/dL 9  11  14    Creatinine 0.61 - 1.24 mg/dL 9.62  9.52  8.41   Sodium 135 - 145 mmol/L 138  138  138   Potassium 3.5 - 5.1 mmol/L 4.2  4.1  4.0   Chloride 98 - 111 mmol/L 105  104  106   CO2 22 - 32 mmol/L 27  29  25    Calcium 8.9 - 10.3 mg/dL 9.5  9.5  9.5   Total Protein 6.5 - 8.1 g/dL 7.5  7.6  7.5   Total Bilirubin <1.2 mg/dL 0.5  0.5  0.4   Alkaline Phos 38 - 126 U/L 55  56  61   AST 15 - 41 U/L 26  25  23    ALT 0 - 44 U/L 36  31  28     No results found for: "MPROTEIN" No results found for: "KPAFRELGTCHN", "LAMBDASER", "KAPLAMBRATIO"   RADIOGRAPHIC STUDIES: No results found.  ASSESSMENT & PLAN Tommy Walters 52 y.o. male with medical history significant for recurrent lower extremity DVTs who presents for a follow up visit.   #Recurrent DVTs: --1st episode in 2011, felt to be provoked for sedentary lifestyle. Treated with Lovenox x 6 months --2nd episode in 2017, felt to be unprovoked. Treated with Xarelto x 6 months.  --3rd episode in 2022, felt to be unprovoked. Started on Eliquis but patient requested to switch to Xarelto in July 2023 for convenience of taking medication once a day.  --Hypercoagulable workup from 10/07/2010 ruled out protein C and S deficiencies, factor 5 leiden mutation, prothrombin gene mutation.  --Elevation noted  in beta-2 glycoprotein IgM, however no elevations in anticardiolipin.  As such the patient does not have triple positive APS.  Okay to continue DOAC therapy at this time. Plan: --continue  Eliquis 2.5 mg twice daily per patient request.   --no evidence of residual or recurrent VTE.  --Labs show white blood cell 7.5, hemoglobin 14.9, MCV 83.1, platelets 210.  Creatinine 1.01 with normal LFTs. --RTC in 6 months with labs.    #Intermittent episodes of Hematochezia: --Last colonoscopy was on 06/27/2019 which showed several polyps and  diverticulosis.  --Discussed symptoms with Dr. Orvan Falconer (gastroenterology), who felt likely cause is due to his known internal hemorrhoids but will follow up with patient to rule out other causes.    No orders of the defined types were placed in this encounter.   All questions were answered. The patient knows to call the clinic with any problems, questions or concerns.  A total of more than 25 minutes were spent on this encounter with face-to-face time and non-face-to-face time, including preparing to see the patient, ordering tests and/or medications, counseling the patient and coordination of care as outlined above.   Ulysees Barns, MD Department of Hematology/Oncology [facility] at [facility] Phone: [phone] Pager: [phone] Email: [email or web address]  07/05/2023 4:14 PM

## 2023-07-10 ENCOUNTER — Other Ambulatory Visit: Payer: Self-pay | Admitting: Physician Assistant

## 2023-09-28 ENCOUNTER — Other Ambulatory Visit: Payer: Self-pay | Admitting: Hematology and Oncology

## 2023-11-05 ENCOUNTER — Ambulatory Visit: Admitting: Nurse Practitioner

## 2023-11-05 ENCOUNTER — Encounter: Payer: Self-pay | Admitting: Nurse Practitioner

## 2023-11-05 VITALS — BP 127/79 | HR 69 | Temp 98.1°F | Wt 219.0 lb

## 2023-11-05 DIAGNOSIS — Z86718 Personal history of other venous thrombosis and embolism: Secondary | ICD-10-CM | POA: Diagnosis not present

## 2023-11-05 DIAGNOSIS — E785 Hyperlipidemia, unspecified: Secondary | ICD-10-CM

## 2023-11-05 DIAGNOSIS — Z7184 Encounter for health counseling related to travel: Secondary | ICD-10-CM | POA: Insufficient documentation

## 2023-11-05 DIAGNOSIS — Z2989 Encounter for other specified prophylactic measures: Secondary | ICD-10-CM | POA: Insufficient documentation

## 2023-11-05 MED ORDER — MEFLOQUINE HCL 250 MG PO TABS: ORAL_TABLET | ORAL | 0 refills | Status: DC

## 2023-11-05 MED ORDER — SIMVASTATIN 40 MG PO TABS: 40.0000 mg | ORAL_TABLET | Freq: Every day | ORAL | 1 refills | Status: DC

## 2023-11-05 NOTE — Patient Instructions (Addendum)
 Yellow Fever Vaccine Entry requirements: Required for all arriving travelers >=9 months old. CDC recommendations: Recommended for all travelers >=9 months old.   . Need for malaria prophylaxis (Primary)  - mefloquine (LARIAM) 250 MG tablet; Take 250 mg once weekly starting 2 weeks before travelling , continuing weekly during travel and for 4 weeks after leaving endemic area  Dispense: 8 tablet   It is important that you exercise regularly at least 30 minutes 5 times a week as tolerated  Think about what you will eat, plan ahead. Choose " clean, green, fresh or frozen" over canned, processed or packaged foods which are more sugary, salty and fatty. 70 to 75% of food eaten should be vegetables and fruit. Three meals at set times with snacks allowed between meals, but they must be fruit or vegetables. Aim to eat over a 12 hour period , example 7 am to 7 pm, and STOP after  your last meal of the day. Drink water,generally about 64 ounces per day, no other drink is as healthy. Fruit juice is best enjoyed in a healthy way, by EATING the fruit.  Thanks for choosing [facility] we consider it a privelige to serve you.

## 2023-11-05 NOTE — Assessment & Plan Note (Signed)
 Lab Results  Component Value Date   CHOL 138 03/02/2023   HDL 49 03/02/2023   LDLCALC 76 03/02/2023   TRIG 62 03/02/2023   CHOLHDL 2.8 03/02/2023  Well-controlled on simvastatin 40 mg daily Continue current medication

## 2023-11-05 NOTE — Assessment & Plan Note (Signed)
 Continue Eliquis 2.5 mg twice daily.

## 2023-11-05 NOTE — Assessment & Plan Note (Signed)
 will stay in [place] for 4 days - mefloquine (LARIAM) 250 MG tablet; Take 250 mg once weekly starting 2 weeks before travelling , continuing weekly during travel and for 4 weeks after leaving endemic area  Dispense: 8 tablet; Refill: 0

## 2023-11-05 NOTE — Assessment & Plan Note (Signed)
 Encouraged to get yellow fever vaccine at the health department   Need for malaria prophylaxis (Primary)  - mefloquine (LARIAM) 250 MG tablet; Take 250 mg once weekly starting 2 weeks before travelling , continuing weekly during travel and for 4 weeks after leaving endemic area  Dispense: 8 tablet; Refill: 0

## 2023-11-05 NOTE — Progress Notes (Signed)
 Established Patient Office Visit  Subjective:  Patient ID: Tommy Walters, male    DOB: 1971/03/03  Age: 53 y.o. MRN: [id number]  CC:  Chief Complaint  Patient presents with   Medical Management of Chronic Issues    HPI Tommy Walters is a 53 y.o. male  has a past medical history of DVT (deep venous thrombosis) (HCC), History of colon polyps (06/2019), and Shortness of breath.    Patient stated that he is traveling to [place] in April this year and would like medication for malaria prophylaxis.  He denies any adverse reactions to current medications Stated that he is up-to-date with flu vaccine and shingles vaccine, records requested from the pharmacy today      Past Medical History:  Diagnosis Date   DVT (deep venous thrombosis) (HCC)    History of colon polyps 06/2019   Shortness of breath     Past Surgical History:  Procedure Laterality Date   NO PAST SURGERIES      Family History  Problem Relation Age of Onset   Hypertension Mother    Diabetes Mother    Stroke Mother 35       died from    Colon cancer Neg Hx    Colon polyps Neg Hx    Esophageal cancer Neg Hx    Rectal cancer Neg Hx    Stomach cancer Neg Hx     Social History   Socioeconomic History   Marital status: Married    Spouse name: Rubin Payor   Number of children: 4   Years of education: MS   Highest education level: Master's degree (e.g., MA, MS, MEng, MEd, MSW, MBA)  Occupational History   Occupation: Film/video editor   Tobacco Use   Smoking status: Never   Smokeless tobacco: Never  Vaping Use   Vaping status: Never Used  Substance and Sexual Activity   Alcohol use: Not Currently    Alcohol/week: 1.0 standard drink of alcohol    Types: 1 Glasses of wine per week    Comment: occ   Drug use: No   Sexual activity: Yes    Birth control/protection: None  Other Topics Concern   Not on file  Social History Narrative   Lives with wife and 3 daughters.   Son is in school elsewhere.    Social Drivers of Health   Financial Resource Strain: Medium Risk (02/27/2023)   Overall Financial Resource Strain (CARDIA)    Difficulty of Paying Living Expenses: Somewhat hard  Food Insecurity: Food Insecurity Present (02/27/2023)   Hunger Vital Sign    Worried About Running Out of Food in the Last Year: Sometimes true    Ran Out of Food in the Last Year: Sometimes true  Transportation Needs: No Transportation Needs (02/27/2023)   PRAPARE - Administrator, Civil Service (Medical): No    Lack of Transportation (Non-Medical): No  Physical Activity: Sufficiently Active (02/27/2023)   Exercise Vital Sign    Days of Exercise per Week: 4 days    Minutes of Exercise per Session: 50 min  Stress: No Stress Concern Present (02/27/2023)   [employer] of Occupational Health - Occupational Stress Questionnaire    Feeling of Stress : Not at all  Social Connections: Socially Integrated (02/27/2023)   Social Connection and Isolation Panel [NHANES]    Frequency of Communication with Friends and Family: More than three times a week    Frequency of Social Gatherings with Friends and Family: More than three  times a week    Attends Religious Services: More than 4 times per year    Active Member of Clubs or Organizations: Yes    Attends Banker Meetings: More than 4 times per year    Marital Status: Living with partner  Intimate Partner Violence: Not on file    Outpatient Medications Prior to Visit  Medication Sig Dispense Refill   acetaminophen (TYLENOL) 500 MG tablet Take 2 tablets (1,000 mg total) by mouth every 6 (six) hours as needed for mild pain, fever or headache. 30 tablet 5   Cholecalciferol (VITAMIN D3 PO) Take 1 tablet by mouth daily.     Cyanocobalamin (B-12 PO) Take 1 tablet by mouth daily.     ELIQUIS 2.5 MG TABS tablet Take 1 tablet by mouth twice daily 180 tablet 0   Glucosamine Sulfate 1000 MG CAPS Take 1 capsule (1,000 mg total) by mouth 2 (two) times  daily. 180 capsule 3   Melatonin-Pyridoxine (MELATIN PO) Take by mouth.     Multiple Vitamin (ONE-A-DAY MENS PO) Take by mouth.     simvastatin (ZOCOR) 40 MG tablet Take 1 tablet (40 mg total) by mouth at bedtime. 90 tablet 1   No facility-administered medications prior to visit.    Allergies  Allergen Reactions   Aspirin Other (See Comments)    Too much causes blood in stool.   Caffeine Other (See Comments)    GI upset   Cherry Nausea And Vomiting    ROS Review of Systems  Constitutional:  Negative for appetite change, chills, fatigue and fever.  HENT:  Negative for congestion, postnasal drip, rhinorrhea and sneezing.   Respiratory:  Negative for cough, shortness of breath and wheezing.   Cardiovascular:  Negative for chest pain, palpitations and leg swelling.  Gastrointestinal:  Negative for abdominal pain, constipation, nausea and vomiting.  Genitourinary:  Negative for difficulty urinating, dysuria, flank pain and frequency.  Musculoskeletal:  Negative for arthralgias, back pain, joint swelling and myalgias.  Skin:  Negative for color change, pallor, rash and wound.  Neurological:  Negative for dizziness, facial asymmetry, weakness, numbness and headaches.  Psychiatric/Behavioral:  Negative for behavioral problems, confusion, self-injury and suicidal ideas.       Objective:    Physical Exam Vitals and nursing note reviewed.  Constitutional:      General: He is not in acute distress.    Appearance: Normal appearance. He is not ill-appearing, toxic-appearing or diaphoretic.  HENT:     Mouth/Throat:     Mouth: Mucous membranes are moist.     Pharynx: Oropharynx is clear. No oropharyngeal exudate or posterior oropharyngeal erythema.  Eyes:     General: No scleral icterus.       Right eye: No discharge.        Left eye: No discharge.     Extraocular Movements: Extraocular movements intact.     Conjunctiva/sclera: Conjunctivae normal.  Cardiovascular:     Rate and  Rhythm: Normal rate and regular rhythm.     Pulses: Normal pulses.     Heart sounds: Normal heart sounds. No murmur heard.    No friction rub. No gallop.  Pulmonary:     Effort: Pulmonary effort is normal. No respiratory distress.     Breath sounds: Normal breath sounds. No stridor. No wheezing, rhonchi or rales.  Chest:     Chest wall: No tenderness.  Abdominal:     General: There is no distension.     Palpations: Abdomen is soft.  Tenderness: There is no abdominal tenderness. There is no right CVA tenderness, left CVA tenderness or guarding.  Musculoskeletal:        General: No swelling, tenderness, deformity or signs of injury.     Right lower leg: No edema.     Left lower leg: No edema.  Skin:    General: Skin is warm and dry.     Capillary Refill: Capillary refill takes less than 2 seconds.     Coloration: Skin is not jaundiced or pale.     Findings: No bruising, erythema or lesion.  Neurological:     Mental Status: He is alert and oriented to person, place, and time.     Motor: No weakness.     Coordination: Coordination normal.     Gait: Gait normal.  Psychiatric:        Mood and Affect: Mood normal.        Behavior: Behavior normal.        Thought Content: Thought content normal.        Judgment: Judgment normal.     BP 127/79   Pulse 69   Temp 98.1 F (36.7 C) (Oral)   Wt 219 lb (99.3 kg)   SpO2 99%   BMI 42.77 kg/m  Wt Readings from Last 3 Encounters:  11/05/23 219 lb (99.3 kg)  07/05/23 217 lb 14.4 oz (98.8 kg)  03/02/23 222 lb 6.4 oz (100.9 kg)    Lab Results  Component Value Date   TSH 1.040 03/02/2023   Lab Results  Component Value Date   WBC 7.5 07/05/2023   HGB 14.9 07/05/2023   HCT 45.3 07/05/2023   MCV 83.1 07/05/2023   PLT 210 07/05/2023   Lab Results  Component Value Date   NA 138 07/05/2023   K 4.2 07/05/2023   CO2 27 07/05/2023   GLUCOSE 87 07/05/2023   BUN 9 07/05/2023   CREATININE 1.01 07/05/2023   BILITOT 0.5 07/05/2023    ALKPHOS 55 07/05/2023   AST 26 07/05/2023   ALT 36 07/05/2023   PROT 7.5 07/05/2023   ALBUMIN 4.0 07/05/2023   CALCIUM 9.5 07/05/2023   ANIONGAP 6 07/05/2023   EGFR 91 03/02/2022   Lab Results  Component Value Date   CHOL 138 03/02/2023   Lab Results  Component Value Date   HDL 49 03/02/2023   Lab Results  Component Value Date   LDLCALC 76 03/02/2023   Lab Results  Component Value Date   TRIG 62 03/02/2023   Lab Results  Component Value Date   CHOLHDL 2.8 03/02/2023   Lab Results  Component Value Date   HGBA1C 5.5 07/25/2019      Assessment & Plan:   Problem List Items Addressed This Visit       Other   History of DVT (deep vein thrombosis)   Continue Eliquis 2.5 mg twice daily      Hyperlipidemia   Lab Results  Component Value Date   CHOL 138 03/02/2023   HDL 49 03/02/2023   LDLCALC 76 03/02/2023   TRIG 62 03/02/2023   CHOLHDL 2.8 03/02/2023  Well-controlled on simvastatin 40 mg daily Continue current medication      Relevant Medications   simvastatin (ZOCOR) 40 MG tablet   Need for malaria prophylaxis - Primary   will stay in [place] for 4 days - mefloquine (LARIAM) 250 MG tablet; Take 250 mg once weekly starting 2 weeks before travelling , continuing weekly during travel and for 4 weeks after  leaving endemic area  Dispense: 8 tablet; Refill: 0        Relevant Medications   mefloquine (LARIAM) 250 MG tablet   Travel advice encounter   Encouraged to get yellow fever vaccine at the health department   Need for malaria prophylaxis (Primary)  - mefloquine (LARIAM) 250 MG tablet; Take 250 mg once weekly starting 2 weeks before travelling , continuing weekly during travel and for 4 weeks after leaving endemic area  Dispense: 8 tablet; Refill: 0          Meds ordered this encounter  Medications   simvastatin (ZOCOR) 40 MG tablet    Sig: Take 1 tablet (40 mg total) by mouth at bedtime.    Dispense:  90 tablet    Refill:  1   mefloquine  (LARIAM) 250 MG tablet    Sig: Take 250 mg once weekly starting 2 weeks before travelling , continuing weekly during travel and for 4 weeks after leaving endemic area    Dispense:  8 tablet    Refill:  0    Follow-up: No follow-ups on file.    Donell Beers, FNP

## 2023-11-07 ENCOUNTER — Telehealth: Payer: Self-pay | Admitting: Nurse Practitioner

## 2023-11-07 NOTE — Telephone Encounter (Signed)
 Copied from CRM [id number]. Topic: Clinical - Prescription Issue >> Nov 07, 2023 11:52 AM Louie Casa B wrote: Reason for CRM: [facility] know thamefloquine (LARIAM) 250 MG tablet is not in stock

## 2023-11-09 ENCOUNTER — Other Ambulatory Visit: Payer: Self-pay | Admitting: Nurse Practitioner

## 2023-11-09 DIAGNOSIS — Z2989 Encounter for other specified prophylactic measures: Secondary | ICD-10-CM

## 2023-11-12 ENCOUNTER — Other Ambulatory Visit: Payer: Self-pay

## 2023-11-12 DIAGNOSIS — Z2989 Encounter for other specified prophylactic measures: Secondary | ICD-10-CM

## 2023-11-12 MED ORDER — MEFLOQUINE HCL 250 MG PO TABS: ORAL_TABLET | ORAL | 0 refills | Status: DC

## 2023-11-16 ENCOUNTER — Other Ambulatory Visit: Payer: Self-pay | Admitting: Nurse Practitioner

## 2023-11-16 DIAGNOSIS — Z2989 Encounter for other specified prophylactic measures: Secondary | ICD-10-CM

## 2023-11-16 MED ORDER — DOXYCYCLINE HYCLATE 100 MG PO TABS: ORAL_TABLET | ORAL | 0 refills | Status: AC

## 2023-11-16 NOTE — Telephone Encounter (Signed)
 Copied from CRM [id number]. Topic: Clinical - Prescription Issue >> Nov 16, 2023  3:18 PM Higinio Roger wrote: Reason for CRM: mefloquine (LARIAM) 250 MG tablet is not available at any [facility], [facility], nor [facility] in a 25 mile radius. [facility] stated it may need to be requested from a different distributor.  Callback #: [id number]

## 2023-11-16 NOTE — Progress Notes (Unsigned)
 Marland Kitchen

## 2023-11-20 NOTE — Telephone Encounter (Signed)
Sent pt my chart message. Kh

## 2023-12-16 ENCOUNTER — Other Ambulatory Visit: Payer: Self-pay | Admitting: Hematology and Oncology

## 2023-12-25 IMAGING — DX DG HAND COMPLETE 3+V*L*
3 series · 3 of 3 positions shown · non-contrast
Comparison: No prior.

CLINICAL DATA: Left hand pain and numbness.

EXAM:
LEFT HAND - COMPLETE 3+ VIEW

[hand pa]
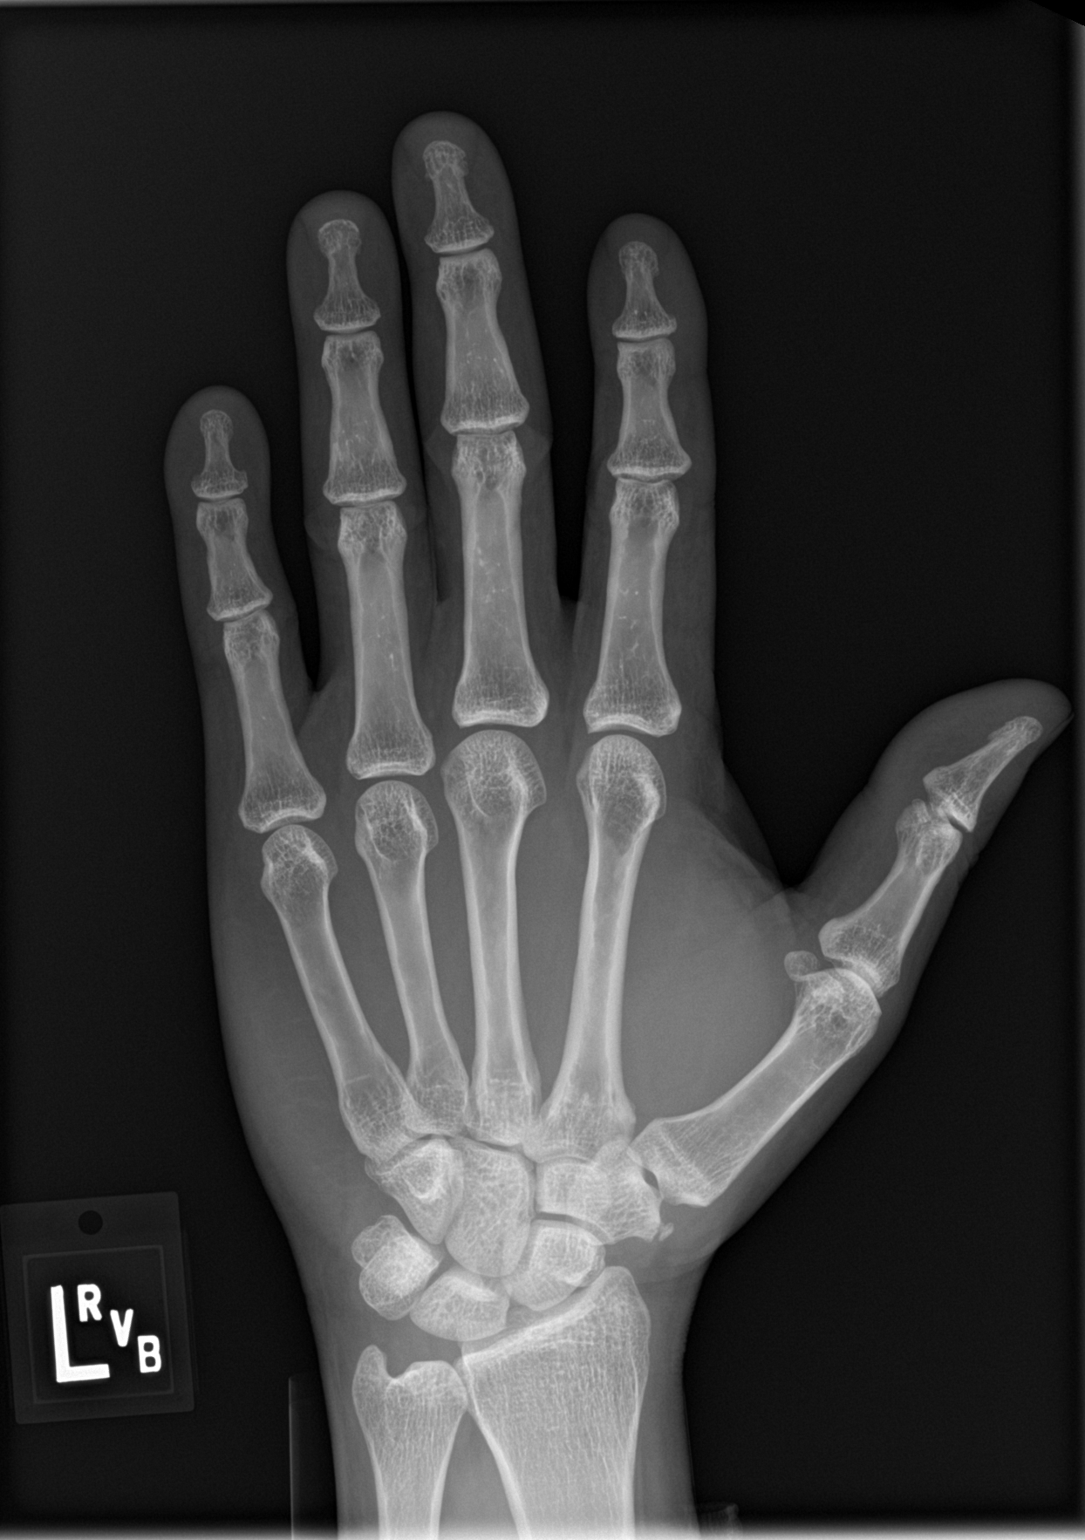

[hand obl]
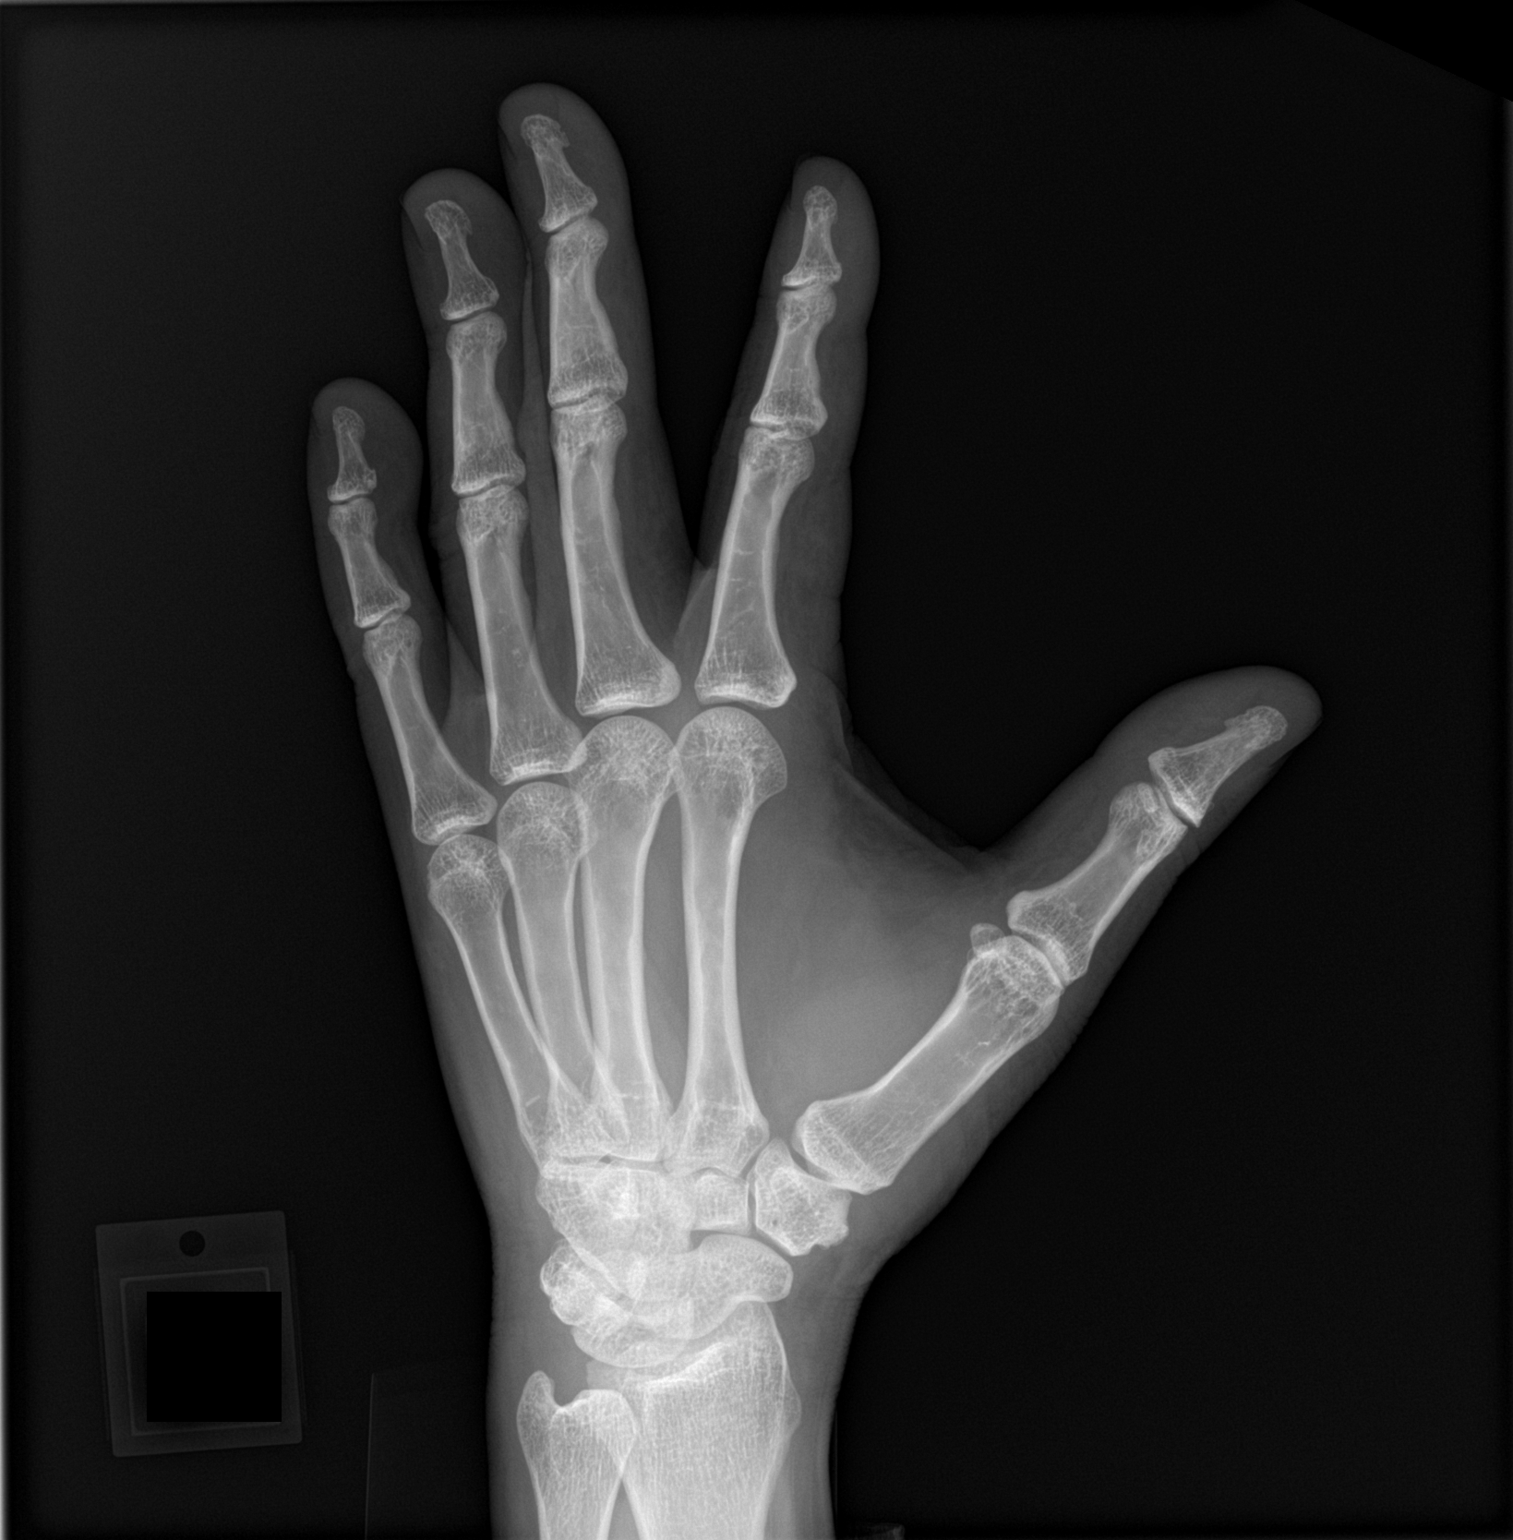

[hand lat]
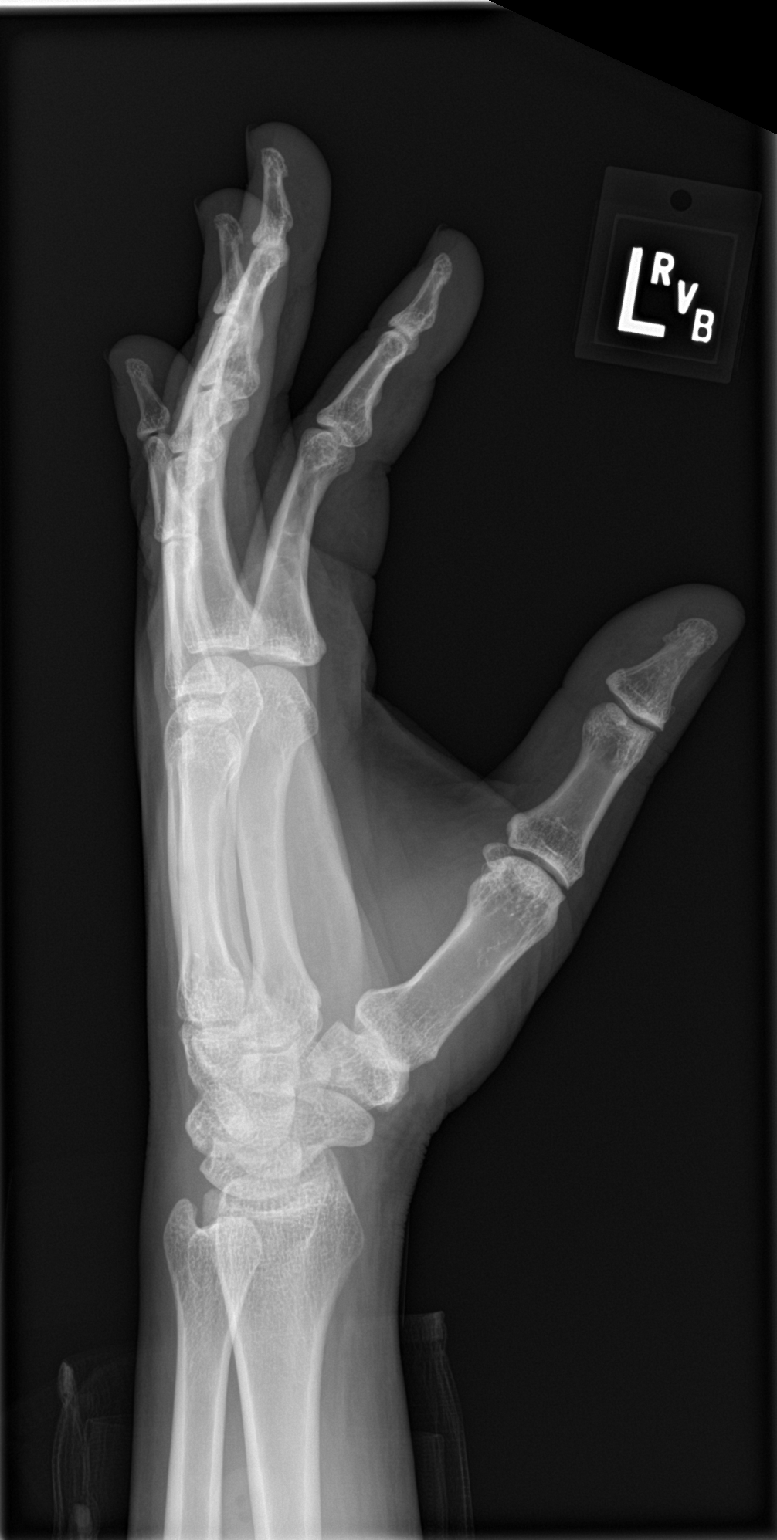

[3 of 3 positions shown; findings below may reference images not displayed]

FINDINGS: Small corticated bony density noted adjacent to the trapezium
consistent with old fracture fragment. No acute bony or joint
abnormality identified. No significant arthropathy. Soft tissues are
unremarkable.
IMPRESSION: Small corticated bony density noted adjacent to the trapezium
consistent with old fracture fragment. No acute bony or joint
abnormality identified.

## 2023-12-27 ENCOUNTER — Other Ambulatory Visit: Payer: Self-pay | Admitting: Physician Assistant

## 2023-12-27 DIAGNOSIS — I82599 Chronic embolism and thrombosis of other specified deep vein of unspecified lower extremity: Secondary | ICD-10-CM

## 2023-12-28 ENCOUNTER — Inpatient Hospital Stay: Payer: 59 | Attending: Physician Assistant | Admitting: Physician Assistant

## 2023-12-28 ENCOUNTER — Inpatient Hospital Stay: Payer: 59

## 2023-12-28 VITALS — BP 112/63 | HR 62 | Temp 97.3°F | Resp 18 | Ht 69.0 in | Wt 221.6 lb

## 2023-12-28 DIAGNOSIS — Z7901 Long term (current) use of anticoagulants: Secondary | ICD-10-CM | POA: Diagnosis not present

## 2023-12-28 DIAGNOSIS — I82599 Chronic embolism and thrombosis of other specified deep vein of unspecified lower extremity: Secondary | ICD-10-CM

## 2023-12-28 DIAGNOSIS — Z79899 Other long term (current) drug therapy: Secondary | ICD-10-CM | POA: Diagnosis not present

## 2023-12-28 DIAGNOSIS — Z86718 Personal history of other venous thrombosis and embolism: Secondary | ICD-10-CM | POA: Insufficient documentation

## 2023-12-28 LAB — CMP (CANCER CENTER ONLY)
ALT: 33 U/L (ref 0–44)
AST: 22 U/L (ref 15–41)
Albumin: 4 g/dL (ref 3.5–5.0)
Alkaline Phosphatase: 56 U/L (ref 38–126)
Anion gap: 3 — ABNORMAL LOW (ref 5–15)
BUN: 13 mg/dL (ref 6–20)
CO2: 30 mmol/L (ref 22–32)
Calcium: 9.3 mg/dL (ref 8.9–10.3)
Chloride: 106 mmol/L (ref 98–111)
Creatinine: 1.05 mg/dL (ref 0.61–1.24)
GFR, Estimated: >60 mL/min (ref >60)
Glucose, Bld: 96 mg/dL (ref 70–99)
Potassium: 4.1 mmol/L (ref 3.5–5.1)
Sodium: 139 mmol/L (ref 135–145)
Total Bilirubin: 0.6 mg/dL (ref 0.0–1.2)
Total Protein: 7.4 g/dL (ref 6.5–8.1)

## 2023-12-28 LAB — CBC WITH DIFFERENTIAL (CANCER CENTER ONLY)
Abs Immature Granulocytes: 0.03 10*3/uL (ref 0.00–0.07)
Basophils Absolute: 0 10*3/uL (ref 0.0–0.1)
Basophils Relative: 1 %
Eosinophils Absolute: 0.4 10*3/uL (ref 0.0–0.5)
Eosinophils Relative: 5 %
HCT: 41.6 % (ref 39.0–52.0)
Hemoglobin: 13.8 g/dL (ref 13.0–17.0)
Immature Granulocytes: 0 %
Lymphocytes Relative: 39 %
Lymphs Abs: 2.7 10*3/uL (ref 0.7–4.0)
MCH: 26.7 pg (ref 26.0–34.0)
MCHC: 33.2 g/dL (ref 30.0–36.0)
MCV: 80.5 fL (ref 80.0–100.0)
Monocytes Absolute: 0.7 10*3/uL (ref 0.1–1.0)
Monocytes Relative: 10 %
Neutro Abs: 3.1 10*3/uL (ref 1.7–7.7)
Neutrophils Relative %: 45 %
Platelet Count: 192 10*3/uL (ref 150–400)
RBC: 5.17 MIL/uL (ref 4.22–5.81)
RDW: 14.5 % (ref 11.5–15.5)
WBC Count: 6.9 10*3/uL (ref 4.0–10.5)
nRBC: 0 % (ref 0.0–0.2)

## 2023-12-28 NOTE — Progress Notes (Signed)
 [facility] [phone]   [phone]  PROGRESS NOTE  Patient Care Team: Paseda, Folashade R, FNP as PCP - General (Nurse Practitioner)  Hematological/Oncological History # Recurrent DVTs 1) 2011: DVT in left leg, felt to be provoked from sedentary lifestyle. Treated with Lovenox x 6 months.    2) 02/11/2016: DVT involving gastronemius vein of the right lower extremity coursing from the mid calf to the confluence with but including the popliteal vein. Treated with Xarelto  x 6 months.    3) 07/22/2021: Age indeterminate deep vein thrombosis involving the left gastrocnemius veins. Prescribed Eliquis    4) 03/02/2022: Switched to Xarelto  per patient preference to take [facility] therapy once a day.    5) 03/31/2022: Establish care with [facility]ematology   Interval History:  Tommy Walters 53 y.o. male with medical history significant for recurrent lower extremity DVTs who presents for a follow up visit. The patient's last visit was on 07/05/2023. In the interim since the last visit he has continued eliquis  therapy.   On exam today Tommy Walters notes he has been well overall with the interim since her last visit.  He is tolerating Eliquis  therapy without any issues.  He denies easy bruising but does have intermittent episodes of hematochezia that he contributes to the consumption of caffeine.  He has no other overt signs of bleeding such as nosebleeds, gum bleeding or hemoptysis.  He denies any signs or symptoms of VTE such as leg pain, leg swelling, chest pain, or shortness of breath.  Overall he feels well and has no questions concerns or complaints today.  A full 10 point ROS was otherwise negative.  MEDICAL HISTORY:  Past Medical History:  Diagnosis Date   DVT (deep venous thrombosis) (HCC)    History of colon polyps 06/2019   Shortness of breath     SURGICAL HISTORY: Past Surgical History:  Procedure Laterality Date   NO PAST SURGERIES      SOCIAL  HISTORY: Social History   Socioeconomic History   Marital status: Married    Spouse name: Synthia Ewing   Number of children: 4   Years of education: MS   Highest education level: Master's degree (e.g., MA, MS, MEng, MEd, MSW, MBA)  Occupational History   Occupation: Film/video editor   Tobacco Use   Smoking status: Never   Smokeless tobacco: Never  Vaping Use   Vaping status: Never Used  Substance and Sexual Activity   Alcohol use: Not Currently    Alcohol/week: 1.0 standard drink of alcohol    Types: 1 Glasses of wine per week    Comment: occ   Drug use: No   Sexual activity: Yes    Birth control/protection: None  Other Topics Concern   Not on file  Social History Narrative   Lives with wife and 3 daughters.   Son is in school elsewhere.   Social Drivers of Health   Financial Resource Strain: Medium Risk (02/27/2023)   Overall Financial Resource Strain (CARDIA)    Difficulty of Paying Living Expenses: Somewhat hard  Food Insecurity: Food Insecurity Present (02/27/2023)   Hunger Vital Sign    Worried About Running Out of Food in the Last Year: Sometimes true    Ran Out of Food in the Last Year: Sometimes true  Transportation Needs: No Transportation Needs (02/27/2023)   PRAPARE - Administrator, Civil Service (Medical): No    Lack of Transportation (Non-Medical): No  Physical Activity: Sufficiently Active (02/27/2023)  Exercise Vital Sign    Days of Exercise per Week: 4 days    Minutes of Exercise per Session: 50 min  Stress: No Stress Concern Present (02/27/2023)   [employer] of Occupational Health - Occupational Stress Questionnaire    Feeling of Stress : Not at all  Social Connections: Socially Integrated (02/27/2023)   Social Connection and Isolation Panel [NHANES]    Frequency of Communication with Friends and Family: More than three times a week    Frequency of Social Gatherings with Friends and Family: More than three times a week    Attends  Religious Services: More than 4 times per year    Active Member of [employer] or Organizations: Yes    Attends Engineer, structural: More than 4 times per year    Marital Status: Living with partner  Intimate Partner Violence: Not on file    FAMILY HISTORY: Family History  Problem Relation Age of Onset   Hypertension Mother    Diabetes Mother    Stroke Mother 45       died from    Colon cancer Neg Hx    Colon polyps Neg Hx    Esophageal cancer Neg Hx    Rectal cancer Neg Hx    Stomach cancer Neg Hx     ALLERGIES:  is allergic to aspirin, caffeine, and cherry.  MEDICATIONS:  Current Outpatient Medications  Medication Sig Dispense Refill   acetaminophen  (TYLENOL ) 500 MG tablet Take 2 tablets (1,000 mg total) by mouth every 6 (six) hours as needed for mild pain, fever or headache. 30 tablet 5   Cholecalciferol (VITAMIN D3 PO) Take 1 tablet by mouth daily.     Cyanocobalamin  (B-12 PO) Take 1 tablet by mouth daily.     doxycycline  (VIBRA -TABS) 100 MG tablet Take 100 mg daily starting 1 to 2 days prior to travel to [place]; continue daily during travel and for 4 weeks after leaving [place] 60 tablet 0   ELIQUIS  2.5 MG TABS tablet Take 1 tablet by mouth twice daily 180 tablet 0   Glucosamine Sulfate 1000 MG CAPS Take 1 capsule (1,000 mg total) by mouth 2 (two) times daily. 180 capsule 3   Melatonin-Pyridoxine (MELATIN PO) Take by mouth.     Multiple Vitamin (ONE-A-DAY MENS PO) Take by mouth.     simvastatin  (ZOCOR ) 40 MG tablet Take 1 tablet (40 mg total) by mouth at bedtime. 90 tablet 1   No current facility-administered medications for this visit.    REVIEW OF SYSTEMS:   Constitutional: ( - ) fevers, ( - )  chills , ( - ) night sweats Eyes: ( - ) blurriness of vision, ( - ) double vision, ( - ) watery eyes Ears, nose, mouth, throat, and face: ( - ) mucositis, ( - ) sore throat Respiratory: ( - ) cough, ( - ) dyspnea, ( - ) wheezes Cardiovascular: ( - ) palpitation, ( - ) chest  discomfort, ( - ) lower extremity swelling Gastrointestinal:  ( - ) nausea, ( - ) heartburn, ( - ) change in bowel habits Skin: ( - ) abnormal skin rashes Lymphatics: ( - ) new lymphadenopathy, ( - ) easy bruising Neurological: ( - ) numbness, ( - ) tingling, ( - ) new weaknesses Behavioral/Psych: ( - ) mood change, ( - ) new changes  All other systems were reviewed with the patient and are negative.  PHYSICAL EXAMINATION:  Vitals:   12/28/23 1333  BP: 112/63  Pulse: 62  Resp: 18  Temp: (!) 97.3 F (36.3 C)  SpO2: 100%     Filed Weights   12/28/23 1333  Weight: 221 lb 9.6 oz (100.5 kg)      GENERAL: Well-appearing middle-aged African-American male, alert, no distress and comfortable SKIN: skin color, texture, turgor are normal, no rashes or significant lesions EYES: conjunctiva are pink and non-injected, sclera clear LUNGS: clear to auscultation and percussion with normal breathing effort HEART: regular rate & rhythm and no murmurs and no lower extremity edema Musculoskeletal: no cyanosis of digits and no clubbing  PSYCH: alert & oriented x 3, fluent speech NEURO: no focal motor/sensory deficits  LABORATORY DATA:  I have reviewed the data as listed    Latest Ref Rng & Units 12/28/2023    1:05 PM 07/05/2023    3:10 PM 01/03/2023    2:43 PM  CBC  WBC 4.0 - 10.5 K/uL 6.9  7.5  6.9   Hemoglobin 13.0 - 17.0 g/dL 04.5  40.9  81.1   Hematocrit 39.0 - 52.0 % 41.6  45.3  44.6   Platelets 150 - 400 K/uL 192  210  203        Latest Ref Rng & Units 12/28/2023    1:05 PM 07/05/2023    3:10 PM 01/03/2023    2:43 PM  CMP  Glucose 70 - 99 mg/dL 96  87  79   BUN 6 - 20 mg/dL 13  9  11    Creatinine 0.61 - 1.24 mg/dL 9.14  7.82  9.56   Sodium 135 - 145 mmol/L 139  138  138   Potassium 3.5 - 5.1 mmol/L 4.1  4.2  4.1   Chloride 98 - 111 mmol/L 106  105  104   CO2 22 - 32 mmol/L 30  27  29    Calcium 8.9 - 10.3 mg/dL 9.3  9.5  9.5   Total Protein 6.5 - 8.1 g/dL 7.4  7.5  7.6    Total Bilirubin 0.0 - 1.2 mg/dL 0.6  0.5  0.5   Alkaline Phos 38 - 126 U/L 56  55  56   AST 15 - 41 U/L 22  26  25    ALT 0 - 44 U/L 33  36  31     No results found for: "MPROTEIN" No results found for: "KPAFRELGTCHN", "LAMBDASER", "KAPLAMBRATIO"   RADIOGRAPHIC STUDIES: No results found.  ASSESSMENT & PLAN Emerson Rallo Hoit is a 53 y.o. male with medical history significant for recurrent lower extremity DVTs who presents for a follow up visit.   #Recurrent DVTs: --1st episode in 2011, felt to be provoked for sedentary lifestyle. Treated with Lovenox x 6 months --2nd episode in 2017, felt to be unprovoked. Treated with Xarelto  x 6 months.  --3rd episode in 2022, felt to be unprovoked. Started on Eliquis  but patient requested to switch to Xarelto  in July 2023 for convenience of taking medication once a day.  --Hypercoagulable workup from 10/07/2010 ruled out protein C and S deficiencies, factor 5 leiden mutation, prothrombin gene mutation.  --Elevation noted in beta-2  glycoprotein IgM, however no elevations in anticardiolipin.  As such the patient does not have triple positive APS.  Okay to continue DOAC therapy at this time. Plan: --continue  Eliquis  2.5 mg twice daily per patient request.   --no evidence of residual or recurrent VTE.  --Labs show white blood cell 6.9, hemoglobin 13.8, MCV 80.5, platelets 192.  Creatinine 1.01 with normal LFTs. --RTC in 6 months with labs.    #  Intermittent episodes of Hematochezia: --Last colonoscopy was on 06/27/2019 which showed several polyps and diverticulosis.  --Patient feels this is related to consuming caffeine. Advised to monitor closely and follow up with his GI team if symptoms don't improve.    No orders of the defined types were placed in this encounter.   All questions were answered. The patient knows to call the clinic with any problems, questions or concerns.   I have spent a total of 25 minutes minutes of face-to-face and  non-face-to-face time, preparing to see the patient,performing a medically appropriate examination, counseling and educating the patient, documenting clinical information in the electronic health record, independently interpreting results and communicating results to the patient, and care coordination.   Wyline Hearing PA-C Dept of Hematology and Oncology [facility] Phone: [phone]   12/28/2023 4:47 PM

## 2024-02-13 ENCOUNTER — Other Ambulatory Visit: Payer: Self-pay | Admitting: Nurse Practitioner

## 2024-02-13 DIAGNOSIS — E785 Hyperlipidemia, unspecified: Secondary | ICD-10-CM

## 2024-02-13 MED ORDER — SIMVASTATIN 40 MG PO TABS: 40.0000 mg | ORAL_TABLET | Freq: Every day | ORAL | 1 refills | Status: AC

## 2024-03-07 ENCOUNTER — Ambulatory Visit: Payer: Self-pay | Admitting: Nurse Practitioner

## 2024-03-07 ENCOUNTER — Encounter: Payer: Self-pay | Admitting: Nurse Practitioner

## 2024-03-07 VITALS — BP 121/63 | HR 65 | Temp 97.5°F | Wt 210.0 lb

## 2024-03-07 DIAGNOSIS — R7989 Other specified abnormal findings of blood chemistry: Secondary | ICD-10-CM | POA: Diagnosis not present

## 2024-03-07 DIAGNOSIS — Z86718 Personal history of other venous thrombosis and embolism: Secondary | ICD-10-CM

## 2024-03-07 DIAGNOSIS — R21 Rash and other nonspecific skin eruption: Secondary | ICD-10-CM | POA: Insufficient documentation

## 2024-03-07 DIAGNOSIS — Z125 Encounter for screening for malignant neoplasm of prostate: Secondary | ICD-10-CM

## 2024-03-07 DIAGNOSIS — E785 Hyperlipidemia, unspecified: Secondary | ICD-10-CM | POA: Diagnosis not present

## 2024-03-07 DIAGNOSIS — Z0000 Encounter for general adult medical examination without abnormal findings: Secondary | ICD-10-CM | POA: Diagnosis not present

## 2024-03-07 DIAGNOSIS — E559 Vitamin D deficiency, unspecified: Secondary | ICD-10-CM | POA: Insufficient documentation

## 2024-03-07 DIAGNOSIS — E669 Obesity, unspecified: Secondary | ICD-10-CM | POA: Insufficient documentation

## 2024-03-07 NOTE — Patient Instructions (Signed)

## 2024-03-07 NOTE — Assessment & Plan Note (Signed)
 Wt Readings from Last 3 Encounters:  03/07/24 210 lb (95.3 kg)  12/28/23 221 lb 9.6 oz (100.5 kg)  11/05/23 219 lb (99.3 kg)   Body mass index is 31.01 kg/m.  Counseled on low-carb diet Encouraged to engage in regular moderate to vigorous exercises at least 150 minutes weekly as tolerated Benefiting of healthy weight discussed

## 2024-03-07 NOTE — Assessment & Plan Note (Signed)
 Continue Eliquis  2.5 mg twice daily Followed by hematology

## 2024-03-07 NOTE — Assessment & Plan Note (Addendum)
 Most likely had folliculitis  nodules noted on the back of the head No redness, swelling discharge or tenderness noted Continue Keflex 500 mg 3 times daily

## 2024-03-07 NOTE — Progress Notes (Addendum)
 Complete physical exam  Patient: Tommy Walters   DOB: [birth year]   52 y.o. Male  MRN: [id number]  Subjective:    Chief Complaint  Patient presents with   Annual Exam    fasting    Tommy Walters is a 53 y.o. male who presents today for a complete physical exam. He reports consuming a general diet.  He exercises on [employer] for an hour 3- 5 days a week He generally feels well. He reports sleeping well.   Uses prescription glasses  Taking Keflex for some rashes on the back of his head, states that the rashes are getting better  Most recent fall risk assessment:    03/02/2023    8:01 AM  Fall Risk   Falls in the past year? 0  Number falls in past yr: 0  Injury with Fall? 0  Risk for fall due to : No Fall Risks  Follow up Falls evaluation completed     Most recent depression screenings:    03/07/2024    8:07 AM 11/05/2023    1:52 PM  PHQ 2/9 Scores  PHQ - 2 Score 0 0  PHQ- 9 Score 3         Patient Care Team: Drexel Ivey R, FNP as PCP - General (Nurse Practitioner)   Outpatient Medications Prior to Visit  Medication Sig   acetaminophen  (TYLENOL ) 500 MG tablet Take 2 tablets (1,000 mg total) by mouth every 6 (six) hours as needed for mild pain, fever or headache.   cephALEXin (KEFLEX) 500 MG capsule Take 500 mg by mouth 3 (three) times daily.   Cholecalciferol (VITAMIN D3 PO) Take 1 tablet by mouth daily.   ELIQUIS  2.5 MG TABS tablet Take 1 tablet by mouth twice daily   Glucosamine Sulfate 1000 MG CAPS Take 1 capsule (1,000 mg total) by mouth 2 (two) times daily.   Melatonin-Pyridoxine (MELATIN PO) Take by mouth.   Multiple Vitamin (ONE-A-DAY MENS PO) Take by mouth.   simvastatin  (ZOCOR ) 40 MG tablet Take 1 tablet (40 mg total) by mouth at bedtime.   Cyanocobalamin  (B-12 PO) Take 1 tablet by mouth daily. (Patient not taking: Reported on 03/07/2024)   doxycycline  (VIBRA -TABS) 100 MG tablet Take 100 mg daily starting 1 to 2 days prior to travel to [place];  continue daily during travel and for 4 weeks after leaving [place]   No facility-administered medications prior to visit.    Review of Systems  Constitutional:  Negative for appetite change, chills, fatigue and fever.  HENT:  Negative for congestion, postnasal drip, rhinorrhea and sneezing.   Eyes:  Negative for pain, discharge and itching.  Respiratory:  Negative for cough, shortness of breath and wheezing.   Cardiovascular:  Negative for chest pain, palpitations and leg swelling.  Gastrointestinal:  Negative for abdominal pain, constipation, nausea and vomiting.  Endocrine: Negative for cold intolerance, heat intolerance and polydipsia.  Genitourinary:  Negative for difficulty urinating, dysuria, flank pain and frequency.  Musculoskeletal:  Negative for arthralgias, back pain, joint swelling and myalgias.  Skin:  Negative for color change and pallor.  Neurological:  Negative for dizziness, facial asymmetry, weakness, numbness and headaches.  Psychiatric/Behavioral:  Negative for behavioral problems, confusion, self-injury and suicidal ideas.        Objective:     BP 121/63   Pulse 65   Temp (!) 97.5 F (36.4 C)   Wt 210 lb (95.3 kg)   SpO2 100%   BMI 31.01 kg/m    Physical  Exam Vitals and nursing note reviewed.  Constitutional:      General: He is not in acute distress.    Appearance: Normal appearance. He is obese. He is not ill-appearing, toxic-appearing or diaphoretic.  HENT:     Right Ear: Tympanic membrane, ear canal and external ear normal. There is no impacted cerumen.     Left Ear: Tympanic membrane, ear canal and external ear normal. There is no impacted cerumen.     Nose: Nose normal. No congestion or rhinorrhea.     Mouth/Throat:     Mouth: Mucous membranes are moist.     Pharynx: Oropharynx is clear. No oropharyngeal exudate or posterior oropharyngeal erythema.  Eyes:     General: No scleral icterus.       Right eye: No discharge.        Left eye: No  discharge.     Extraocular Movements: Extraocular movements intact.     Conjunctiva/sclera: Conjunctivae normal.  Neck:     Vascular: No carotid bruit.  Cardiovascular:     Rate and Rhythm: Normal rate and regular rhythm.     Pulses: Normal pulses.     Heart sounds: Normal heart sounds. No murmur heard.    No friction rub. No gallop.  Pulmonary:     Effort: Pulmonary effort is normal. No respiratory distress.     Breath sounds: Normal breath sounds. No stridor. No wheezing, rhonchi or rales.  Chest:     Chest wall: No tenderness.  Abdominal:     General: Bowel sounds are normal. There is no distension.     Palpations: Abdomen is soft. There is no mass.     Tenderness: There is no abdominal tenderness. There is no right CVA tenderness, left CVA tenderness, guarding or rebound.     Hernia: No hernia is present.  Musculoskeletal:        General: No swelling, tenderness, deformity or signs of injury.     Cervical back: Normal range of motion and neck supple. No rigidity or tenderness.     Right lower leg: No edema.     Left lower leg: No edema.  Lymphadenopathy:     Cervical: No cervical adenopathy.  Skin:    General: Skin is warm and dry.     Capillary Refill: Capillary refill takes less than 2 seconds.     Coloration: Skin is not jaundiced or pale.     Findings: No bruising or erythema.     Comments: Nodules on the back of the head  Neurological:     Mental Status: He is alert and oriented to person, place, and time.     Cranial Nerves: No cranial nerve deficit.     Motor: No weakness.     Gait: Gait normal.  Psychiatric:        Mood and Affect: Mood normal.        Behavior: Behavior normal.        Thought Content: Thought content normal.        Judgment: Judgment normal.     No results found for any visits on 03/07/24.     Assessment & Plan:    Routine Health Maintenance and Physical Exam  Immunization History  Administered Date(s) Administered   Influenza Split  07/05/2012   Influenza, Seasonal, Injecte, Preservative Fre 06/28/2014   Influenza,inj,Quad PF,6+ Mos 06/14/2016, 05/18/2017, 06/21/2018, 05/14/2019, 05/04/2020, 05/07/2021, 04/18/2022   Influenza,trivalent, recombinat, inj, PF 07/05/2012   Influenza-Unspecified 06/28/2014, 06/28/2014   Janssen (J&J) SARS-COV-2 Vaccination 11/04/2019  Moderna Sars-Covid-2 Vaccination 05/21/2022   PFIZER(Purple Top)SARS-COV-2 Vaccination 07/03/2020, 04/30/2021   Tdap 08/03/2005, 07/05/2012, 07/17/2016, 03/22/2023    Health Maintenance  Topic Date Due   Hepatitis B Vaccines (1 of 3 - 19+ 3-dose series) Never done   Zoster Vaccines- Shingrix (1 of 2) Never done   COVID-19 Vaccine (5 - 2024-25 season) 04/15/2023   INFLUENZA VACCINE  03/14/2024   Colonoscopy  06/30/2025   DTaP/Tdap/Td (5 - Td or Tdap) 03/21/2033   Hepatitis C Screening  Completed   HIV Screening  Completed   HPV VACCINES  Aged Out   Meningococcal B Vaccine  Aged Out    Discussed health benefits of physical activity, and encouraged him to engage in regular exercise appropriate for his age and condition.  Problem List Items Addressed This Visit       Musculoskeletal and Integument   Rash and other nonspecific skin eruption   Most likely had folliculitis  nodules noted on the back of the head No redness, swelling discharge or tenderness noted Continue Keflex 500 mg 3 times daily        Other   Prostate cancer screening   Relevant Orders   PSA   History of DVT (deep vein thrombosis)   Continue Eliquis  2.5 mg twice daily Followed by hematology      Hyperlipidemia   Continue simvastatin  40 mg daily Checking labs      Relevant Orders   Lipid panel   Annual physical exam - Primary   Annual exam as documented.  Counseling done include healthy lifestyle involving committing to 150 minutes of exercise per week, heart healthy diet, and attaining healthy weight. The importance of adequate sleep also discussed.  Regular use of  seat belt and  discussed . Changes in health habits are decided on by patient with goals and time frames set for achieving them. Immunization and cancer screening  needs are specifically addressed at this visit.   Up-to-date with colon cancer screening, shingles vaccine, Tdap vaccine      Obesity (BMI 30-39.9)   Wt Readings from Last 3 Encounters:  03/07/24 210 lb (95.3 kg)  12/28/23 221 lb 9.6 oz (100.5 kg)  11/05/23 219 lb (99.3 kg)   Body mass index is 31.01 kg/m.  Counseled on low-carb diet Encouraged to engage in regular moderate to vigorous exercises at least 150 minutes weekly as tolerated Benefiting of healthy weight discussed      Elevated vitamin B12 level   Lab Results  Component Value Date   VITAMINB12 >2000 (H) 03/02/2023  Currently not taking vitamin B12 Checking labs today      Relevant Orders   Vitamin B12   Vitamin D  deficiency   Last vitamin D  Lab Results  Component Value Date   VD25OH 27.8 (L) 09/24/2020  On vitamin D  supplement Checking labs      Relevant Orders   VITAMIN D  25 Hydroxy (Vit-D Deficiency, Fractures)   Return in about 1 year (around 03/07/2025).     Takela Varden R Elvena Oyer, FNP

## 2024-03-07 NOTE — Assessment & Plan Note (Signed)
 Continue simvastatin  40 mg daily Checking labs

## 2024-03-07 NOTE — Assessment & Plan Note (Signed)
 Lab Results  Component Value Date   VITAMINB12 >2000 (H) 03/02/2023  Currently not taking vitamin B12 Checking labs today

## 2024-03-07 NOTE — Assessment & Plan Note (Signed)
 Annual exam as documented.  Counseling done include healthy lifestyle involving committing to 150 minutes of exercise per week, heart healthy diet, and attaining healthy weight. The importance of adequate sleep also discussed.  Regular use of seat belt and  discussed . Changes in health habits are decided on by patient with goals and time frames set for achieving them. Immunization and cancer screening  needs are specifically addressed at this visit.   Up-to-date with colon cancer screening, shingles vaccine, Tdap vaccine

## 2024-03-07 NOTE — Assessment & Plan Note (Signed)
 Last vitamin D  Lab Results  Component Value Date   VD25OH 27.8 (L) 09/24/2020  On vitamin D  supplement Checking labs

## 2024-03-08 LAB — LIPID PANEL
Chol/HDL Ratio: 2.9 ratio (ref 0.0–5.0)
Cholesterol, Total: 154 mg/dL (ref 100–199)
HDL: 54 mg/dL (ref >39)
LDL Chol Calc (NIH): 89 mg/dL (ref 0–99)
Triglycerides: 54 mg/dL (ref 0–149)
VLDL Cholesterol Cal: 11 mg/dL (ref 5–40)

## 2024-03-08 LAB — PSA: Prostate Specific Ag, Serum: 1.3 ng/mL (ref 0.0–4.0)

## 2024-03-08 LAB — VITAMIN B12: Vitamin B-12: 964 pg/mL (ref 232–1245)

## 2024-03-08 LAB — VITAMIN D 25 HYDROXY (VIT D DEFICIENCY, FRACTURES): Vit D, 25-Hydroxy: 84 ng/mL (ref 30.0–100.0)

## 2024-03-10 ENCOUNTER — Ambulatory Visit: Payer: Self-pay | Admitting: Nurse Practitioner

## 2024-03-14 ENCOUNTER — Other Ambulatory Visit: Payer: Self-pay | Admitting: Nurse Practitioner

## 2024-03-14 ENCOUNTER — Telehealth: Payer: Self-pay

## 2024-03-14 DIAGNOSIS — R21 Rash and other nonspecific skin eruption: Secondary | ICD-10-CM

## 2024-03-14 NOTE — Telephone Encounter (Signed)
 Pt is requesting a referral to dermatology for his back. Please advise [facility]

## 2024-03-14 NOTE — Telephone Encounter (Signed)
 Pt was advised [facility]

## 2024-04-15 ENCOUNTER — Other Ambulatory Visit: Payer: Self-pay | Admitting: Physician Assistant

## 2024-07-04 ENCOUNTER — Inpatient Hospital Stay: Admitting: Hematology and Oncology

## 2024-07-04 ENCOUNTER — Other Ambulatory Visit: Payer: Self-pay | Admitting: Hematology and Oncology

## 2024-07-04 ENCOUNTER — Inpatient Hospital Stay: Attending: Physician Assistant

## 2024-07-04 VITALS — BP 131/72 | HR 70 | Temp 98.2°F | Resp 14 | Wt 214.4 lb

## 2024-07-04 DIAGNOSIS — Z79899 Other long term (current) drug therapy: Secondary | ICD-10-CM | POA: Diagnosis not present

## 2024-07-04 DIAGNOSIS — R7689 Other specified abnormal immunological findings in serum: Secondary | ICD-10-CM | POA: Diagnosis not present

## 2024-07-04 DIAGNOSIS — Z86718 Personal history of other venous thrombosis and embolism: Secondary | ICD-10-CM | POA: Insufficient documentation

## 2024-07-04 DIAGNOSIS — I82599 Chronic embolism and thrombosis of other specified deep vein of unspecified lower extremity: Secondary | ICD-10-CM

## 2024-07-04 DIAGNOSIS — Z7901 Long term (current) use of anticoagulants: Secondary | ICD-10-CM | POA: Diagnosis not present

## 2024-07-04 DIAGNOSIS — R7989 Other specified abnormal findings of blood chemistry: Secondary | ICD-10-CM | POA: Insufficient documentation

## 2024-07-04 LAB — CBC WITH DIFFERENTIAL (CANCER CENTER ONLY)
Abs Immature Granulocytes: 0.01 K/uL (ref 0.00–0.07)
Basophils Absolute: 0 K/uL (ref 0.0–0.1)
Basophils Relative: 0 %
Eosinophils Absolute: 0.3 K/uL (ref 0.0–0.5)
Eosinophils Relative: 3 %
HCT: 43.5 % (ref 39.0–52.0)
Hemoglobin: 14.3 g/dL (ref 13.0–17.0)
Immature Granulocytes: 0 %
Lymphocytes Relative: 37 %
Lymphs Abs: 2.9 K/uL (ref 0.7–4.0)
MCH: 26.7 pg (ref 26.0–34.0)
MCHC: 32.9 g/dL (ref 30.0–36.0)
MCV: 81.3 fL (ref 80.0–100.0)
Monocytes Absolute: 0.7 K/uL (ref 0.1–1.0)
Monocytes Relative: 8 %
Neutro Abs: 4 K/uL (ref 1.7–7.7)
Neutrophils Relative %: 52 %
Platelet Count: 223 K/uL (ref 150–400)
RBC: 5.35 MIL/uL (ref 4.22–5.81)
RDW: 13.5 % (ref 11.5–15.5)
WBC Count: 7.8 K/uL (ref 4.0–10.5)
nRBC: 0 % (ref 0.0–0.2)

## 2024-07-04 LAB — CMP (CANCER CENTER ONLY)
ALT: 148 U/L — ABNORMAL HIGH (ref 0–44)
AST: 269 U/L (ref 15–41)
Albumin: 4.4 g/dL (ref 3.5–5.0)
Alkaline Phosphatase: 67 U/L (ref 38–126)
Anion gap: 9 (ref 5–15)
BUN: 10 mg/dL (ref 6–20)
CO2: 25 mmol/L (ref 22–32)
Calcium: 9.5 mg/dL (ref 8.9–10.3)
Chloride: 103 mmol/L (ref 98–111)
Creatinine: 1.02 mg/dL (ref 0.61–1.24)
GFR, Estimated: >60 mL/min (ref >60)
Glucose, Bld: 85 mg/dL (ref 70–99)
Potassium: 4 mmol/L (ref 3.5–5.1)
Sodium: 137 mmol/L (ref 135–145)
Total Bilirubin: 0.5 mg/dL (ref 0.0–1.2)
Total Protein: 8.1 g/dL (ref 6.5–8.1)

## 2024-07-04 NOTE — Progress Notes (Signed)
 [facility] [phone]   [phone]  PROGRESS NOTE  Patient Care Team: Paseda, Folashade R, FNP as PCP - General (Nurse Practitioner)  Hematological/Oncological History # Recurrent DVTs 1) 2011: DVT in left leg, felt to be provoked from sedentary lifestyle. Treated with Lovenox x 6 months.    2) 02/11/2016: DVT involving gastronemius vein of the right lower extremity coursing from the mid calf to the confluence with but including the popliteal vein. Treated with Xarelto  x 6 months.    3) 07/22/2021: Age indeterminate deep vein thrombosis involving the left gastrocnemius veins. Prescribed Eliquis    4) 03/02/2022: Switched to Xarelto  per patient preference to take [facility] therapy once a day.    5) 03/31/2022: Establish care with [facility]   Interval History:  Tommy Walters 53 y.o. male with medical history significant for recurrent lower extremity DVTs who presents for a follow up visit. The patient's last visit was on 12/28/2023. In the interim since the last visit he has continued eliquis  therapy.   On exam today Tommy Walters reports that he has been well overall with interim since her last visit with no major changes in his health.  He said no recent hospitalizations or ER visits.  He reports he is tolerating his Eliquis  therapy well with no bleeding, bruising, or dark stools.  He is not seeing any blood in the stool or urine.  He reports that he is tolerating the medication without any other side effects.  He notes that the medication cost him for $10 for a month supply.  He reports he does have some occasional calf pain but it resolves and returns intermittently.  He notes that he does wear compression socks and it does help.  He does not have any other signs or symptoms concerning for recurrent VTE such as leg pain, leg swelling, chest pain, shortness of breath.  He reports that he takes Tylenol  1 to 2 pills sparingly and had 2 glasses of wine about 1  week ago.  He has had no recent GI illness such as nausea, vomiting, or diarrhea.  A full 10 point ROS otherwise negative.  MEDICAL HISTORY:  Past Medical History:  Diagnosis Date   DVT (deep venous thrombosis) (HCC)    History of colon polyps 06/2019   Shortness of breath     SURGICAL HISTORY: Past Surgical History:  Procedure Laterality Date   NO PAST SURGERIES      SOCIAL HISTORY: Social History   Socioeconomic History   Marital status: Married    Spouse name: Tommy Walters   Number of children: 4   Years of education: MS   Highest education level: Master's degree (e.g., MA, MS, MEng, MEd, MSW, MBA)  Occupational History   Occupation: Film/video Editor   Tobacco Use   Smoking status: Never   Smokeless tobacco: Never  Vaping Use   Vaping status: Never Used  Substance and Sexual Activity   Alcohol use: Not Currently    Alcohol/week: 1.0 standard drink of alcohol    Types: 1 Glasses of wine per week    Comment: occ   Drug use: No   Sexual activity: Yes    Birth control/protection: None  Other Topics Concern   Not on file  Social History Narrative   Lives with wife and  daughter.    Social Drivers of Health   Financial Resource Strain: Medium Risk (03/03/2024)   Overall Financial Resource Strain (CARDIA)    Difficulty of Paying Living Expenses: Somewhat  hard  Food Insecurity: Food Insecurity Present (03/03/2024)   Hunger Vital Sign    Worried About Running Out of Food in the Last Year: Not on file    Ran Out of Food in the Last Year: Sometimes true  Transportation Needs: No Transportation Needs (03/03/2024)   PRAPARE - Administrator, Civil Service (Medical): No    Lack of Transportation (Non-Medical): No  Physical Activity: Insufficiently Active (03/03/2024)   Exercise Vital Sign    Days of Exercise per Week: 3 days    Minutes of Exercise per Session: 40 min  Stress: Stress Concern Present (03/03/2024)   [employer] of Occupational Health -  Occupational Stress Questionnaire    Feeling of Stress: To some extent  Social Connections: Socially Integrated (03/03/2024)   Social Connection and Isolation Panel    Frequency of Communication with Friends and Family: More than three times a week    Frequency of Social Gatherings with Friends and Family: Twice a week    Attends Religious Services: More than 4 times per year    Active Member of [employer] or Organizations: Yes    Attends Banker Meetings: 1 to 4 times per year    Marital Status: Living with partner  Intimate Partner Violence: Not on file    FAMILY HISTORY: Family History  Problem Relation Age of Onset   Hypertension Mother    Diabetes Mother    Stroke Mother 16       died from    Colon cancer Neg Hx    Colon polyps Neg Hx    Esophageal cancer Neg Hx    Rectal cancer Neg Hx    Stomach cancer Neg Hx     ALLERGIES:  is allergic to aspirin, caffeine, and cherry.  MEDICATIONS:  Current Outpatient Medications  Medication Sig Dispense Refill   acetaminophen  (TYLENOL ) 500 MG tablet Take 2 tablets (1,000 mg total) by mouth every 6 (six) hours as needed for mild pain, fever or headache. 30 tablet 5   cephALEXin (KEFLEX) 500 MG capsule Take 500 mg by mouth 3 (three) times daily.     Cholecalciferol (VITAMIN D3 PO) Take 1 tablet by mouth daily.     Cyanocobalamin  (B-12 PO) Take 1 tablet by mouth daily. (Patient not taking: Reported on 03/07/2024)     doxycycline  (VIBRA -TABS) 100 MG tablet Take 100 mg daily starting 1 to 2 days prior to travel to [place]; continue daily during travel and for 4 weeks after leaving [place] 60 tablet 0   ELIQUIS  2.5 MG TABS tablet Take 1 tablet by mouth twice daily 180 tablet 0   Glucosamine Sulfate 1000 MG CAPS Take 1 capsule (1,000 mg total) by mouth 2 (two) times daily. 180 capsule 3   Melatonin-Pyridoxine (MELATIN PO) Take by mouth.     Multiple Vitamin (ONE-A-DAY MENS PO) Take by mouth.     simvastatin  (ZOCOR ) 40 MG tablet Take 1  tablet (40 mg total) by mouth at bedtime. 90 tablet 1   No current facility-administered medications for this visit.    REVIEW OF SYSTEMS:   Constitutional: ( - ) fevers, ( - )  chills , ( - ) night sweats Eyes: ( - ) blurriness of vision, ( - ) double vision, ( - ) watery eyes Ears, nose, mouth, throat, and face: ( - ) mucositis, ( - ) sore throat Respiratory: ( - ) cough, ( - ) dyspnea, ( - ) wheezes Cardiovascular: ( - ) palpitation, ( - )  chest discomfort, ( - ) lower extremity swelling Gastrointestinal:  ( - ) nausea, ( - ) heartburn, ( - ) change in bowel habits Skin: ( - ) abnormal skin rashes Lymphatics: ( - ) new lymphadenopathy, ( - ) easy bruising Neurological: ( - ) numbness, ( - ) tingling, ( - ) new weaknesses Behavioral/Psych: ( - ) mood change, ( - ) new changes  All other systems were reviewed with the patient and are negative.  PHYSICAL EXAMINATION:  Vitals:   07/04/24 1516  BP: 131/72  Pulse: 70  Resp: 14  Temp: 98.2 F (36.8 C)  SpO2: 100%      Filed Weights   07/04/24 1516  Weight: 214 lb 6.4 oz (97.3 kg)       GENERAL: Well-appearing middle-aged African-American male, alert, no distress and comfortable SKIN: skin color, texture, turgor are normal, no rashes or significant lesions EYES: conjunctiva are pink and non-injected, sclera clear LUNGS: clear to auscultation and percussion with normal breathing effort HEART: regular rate & rhythm and no murmurs and no lower extremity edema Musculoskeletal: no cyanosis of digits and no clubbing  PSYCH: alert & oriented x 3, fluent speech NEURO: no focal motor/sensory deficits  LABORATORY DATA:  I have reviewed the data as listed    Latest Ref Rng & Units 07/04/2024    2:40 PM 12/28/2023    1:05 PM 07/05/2023    3:10 PM  CBC  WBC 4.0 - 10.5 K/uL 7.8  6.9  7.5   Hemoglobin 13.0 - 17.0 g/dL 85.6  86.1  85.0   Hematocrit 39.0 - 52.0 % 43.5  41.6  45.3   Platelets 150 - 400 K/uL 223  192  210         Latest Ref Rng & Units 07/04/2024    2:40 PM 12/28/2023    1:05 PM 07/05/2023    3:10 PM  CMP  Glucose 70 - 99 mg/dL 85  96  87   BUN 6 - 20 mg/dL 10  13  9    Creatinine 0.61 - 1.24 mg/dL 8.97  8.94  8.98   Sodium 135 - 145 mmol/L 137  139  138   Potassium 3.5 - 5.1 mmol/L 4.0  4.1  4.2   Chloride 98 - 111 mmol/L 103  106  105   CO2 22 - 32 mmol/L 25  30  27    Calcium 8.9 - 10.3 mg/dL 9.5  9.3  9.5   Total Protein 6.5 - 8.1 g/dL 8.1  7.4  7.5   Total Bilirubin 0.0 - 1.2 mg/dL 0.5  0.6  0.5   Alkaline Phos 38 - 126 U/L 67  56  55   AST 15 - 41 U/L 269  22  26   ALT 0 - 44 U/L 148  33  36     No results found for: MPROTEIN No results found for: KPAFRELGTCHN, LAMBDASER, KAPLAMBRATIO   RADIOGRAPHIC STUDIES: No results found.  ASSESSMENT & PLAN Tommy Walters is a 53 y.o. male with medical history significant for recurrent lower extremity DVTs who presents for a follow up visit.   #Recurrent DVTs: --1st episode in 2011, felt to be provoked for sedentary lifestyle. Treated with Lovenox x 6 months --2nd episode in 2017, felt to be unprovoked. Treated with Xarelto  x 6 months.  --3rd episode in 2022, felt to be unprovoked. Started on Eliquis  but patient requested to switch to Xarelto  in July 2023 for convenience of taking medication once a day.  --  Hypercoagulable workup from 10/07/2010 ruled out protein C and S deficiencies, factor 5 leiden mutation, prothrombin gene mutation.  --Elevation noted in beta-2  glycoprotein IgM, however no elevations in anticardiolipin.  As such the patient does not have triple positive APS.  Okay to continue DOAC therapy at this time. Plan: --continue  Eliquis  2.5 mg twice daily per patient request.   --no evidence of residual or recurrent VTE.  --Labs show white blood cell 7.8, Hgb 14.3, MCV 81.3, Plt 223 with normal LFTs. --RTC in 6 months with labs.   # Elevated LFTs --labs today show AST 269, ALT 148, Bilirubin 0.5. Alk phosphatase 67   --patient notes sparing Tylenol  use and no recent alcohol use. --recommend rechecking labs again on Tuesday and notifying his GI provider.  Patient previously saw Dr. Eda, will reach out to [facility].   #Intermittent episodes of Hematochezia: --Last colonoscopy was on 06/27/2019 which showed several polyps and diverticulosis.  --Patient feels this is related to consuming caffeine. Advised to monitor closely and follow up with his GI team if symptoms don't improve.  --No reports of GI bleeding at this time.   No orders of the defined types were placed in this encounter.   All questions were answered. The patient knows to call the clinic with any problems, questions or concerns.   I have spent a total of 25 minutes minutes of face-to-face and non-face-to-face time, preparing to see the patient,performing a medically appropriate examination, counseling and educating the patient, documenting clinical information in the electronic health record, independently interpreting results and communicating results to the patient, and care coordination.   Norleen IVAR Kidney, MD Department of Hematology/Oncology [facility] Phone: [phone] Pager: [phone] Email: [email or web address]    07/04/2024 4:28 PM

## 2024-07-08 ENCOUNTER — Inpatient Hospital Stay

## 2024-07-08 ENCOUNTER — Telehealth: Payer: Self-pay

## 2024-07-08 DIAGNOSIS — Z86718 Personal history of other venous thrombosis and embolism: Secondary | ICD-10-CM | POA: Diagnosis not present

## 2024-07-08 DIAGNOSIS — I82599 Chronic embolism and thrombosis of other specified deep vein of unspecified lower extremity: Secondary | ICD-10-CM

## 2024-07-08 LAB — CBC WITH DIFFERENTIAL (CANCER CENTER ONLY)
Abs Immature Granulocytes: 0.02 K/uL (ref 0.00–0.07)
Basophils Absolute: 0.1 K/uL (ref 0.0–0.1)
Basophils Relative: 1 %
Eosinophils Absolute: 0.3 K/uL (ref 0.0–0.5)
Eosinophils Relative: 5 %
HCT: 45.6 % (ref 39.0–52.0)
Hemoglobin: 14.7 g/dL (ref 13.0–17.0)
Immature Granulocytes: 0 %
Lymphocytes Relative: 48 %
Lymphs Abs: 3.5 K/uL (ref 0.7–4.0)
MCH: 26.7 pg (ref 26.0–34.0)
MCHC: 32.2 g/dL (ref 30.0–36.0)
MCV: 82.9 fL (ref 80.0–100.0)
Monocytes Absolute: 0.6 K/uL (ref 0.1–1.0)
Monocytes Relative: 8 %
Neutro Abs: 2.8 K/uL (ref 1.7–7.7)
Neutrophils Relative %: 38 %
Platelet Count: 237 K/uL (ref 150–400)
RBC: 5.5 MIL/uL (ref 4.22–5.81)
RDW: 13.2 % (ref 11.5–15.5)
WBC Count: 7.2 K/uL (ref 4.0–10.5)
nRBC: 0 % (ref 0.0–0.2)

## 2024-07-08 LAB — CMP (CANCER CENTER ONLY)
ALT: 77 U/L — ABNORMAL HIGH (ref 0–44)
AST: 50 U/L — ABNORMAL HIGH (ref 15–41)
Albumin: 4.5 g/dL (ref 3.5–5.0)
Alkaline Phosphatase: 66 U/L (ref 38–126)
Anion gap: 8 (ref 5–15)
BUN: 9 mg/dL (ref 6–20)
CO2: 27 mmol/L (ref 22–32)
Calcium: 9.7 mg/dL (ref 8.9–10.3)
Chloride: 103 mmol/L (ref 98–111)
Creatinine: 1.03 mg/dL (ref 0.61–1.24)
GFR, Estimated: >60 mL/min (ref >60)
Glucose, Bld: 83 mg/dL (ref 70–99)
Potassium: 4 mmol/L (ref 3.5–5.1)
Sodium: 138 mmol/L (ref 135–145)
Total Bilirubin: 0.3 mg/dL (ref 0.0–1.2)
Total Protein: 8.2 g/dL — ABNORMAL HIGH (ref 6.5–8.1)

## 2024-07-08 NOTE — Telephone Encounter (Signed)
 Per Dr Federico, TC to Tommy Walters and advised that his liver labs are better than last week, but still slightly higher than normal. Instructed to return for labs in 4 weeks to assure they normalize entirely. Tommy Walters verbalized understanding. Advised scheduling would contact him for lab appt. Scheduling notified.

## 2024-07-16 ENCOUNTER — Other Ambulatory Visit: Payer: Self-pay | Admitting: Physician Assistant

## 2024-08-05 ENCOUNTER — Other Ambulatory Visit: Payer: Self-pay | Admitting: Hematology and Oncology

## 2024-08-05 ENCOUNTER — Inpatient Hospital Stay: Attending: Physician Assistant

## 2024-08-05 DIAGNOSIS — I82599 Chronic embolism and thrombosis of other specified deep vein of unspecified lower extremity: Secondary | ICD-10-CM

## 2024-08-05 DIAGNOSIS — Z7901 Long term (current) use of anticoagulants: Secondary | ICD-10-CM | POA: Diagnosis not present

## 2024-08-05 DIAGNOSIS — Z86718 Personal history of other venous thrombosis and embolism: Secondary | ICD-10-CM | POA: Insufficient documentation

## 2024-08-05 LAB — CMP (CANCER CENTER ONLY)
ALT: 42 U/L (ref 0–44)
AST: 29 U/L (ref 15–41)
Albumin: 4.3 g/dL (ref 3.5–5.0)
Alkaline Phosphatase: 66 U/L (ref 38–126)
Anion gap: 9 (ref 5–15)
BUN: 11 mg/dL (ref 6–20)
CO2: 25 mmol/L (ref 22–32)
Calcium: 9.3 mg/dL (ref 8.9–10.3)
Chloride: 105 mmol/L (ref 98–111)
Creatinine: 1.01 mg/dL (ref 0.61–1.24)
GFR, Estimated: >60 mL/min
Glucose, Bld: 96 mg/dL (ref 70–99)
Potassium: 4.2 mmol/L (ref 3.5–5.1)
Sodium: 140 mmol/L (ref 135–145)
Total Bilirubin: 0.4 mg/dL (ref 0.0–1.2)
Total Protein: 7.4 g/dL (ref 6.5–8.1)

## 2024-08-05 LAB — CBC WITH DIFFERENTIAL (CANCER CENTER ONLY)
Abs Immature Granulocytes: 0.01 K/uL (ref 0.00–0.07)
Basophils Absolute: 0.1 K/uL (ref 0.0–0.1)
Basophils Relative: 1 %
Eosinophils Absolute: 0.4 K/uL (ref 0.0–0.5)
Eosinophils Relative: 5 %
HCT: 41 % (ref 39.0–52.0)
Hemoglobin: 13.6 g/dL (ref 13.0–17.0)
Immature Granulocytes: 0 %
Lymphocytes Relative: 39 %
Lymphs Abs: 2.5 K/uL (ref 0.7–4.0)
MCH: 27 pg (ref 26.0–34.0)
MCHC: 33.2 g/dL (ref 30.0–36.0)
MCV: 81.5 fL (ref 80.0–100.0)
Monocytes Absolute: 0.9 K/uL (ref 0.1–1.0)
Monocytes Relative: 13 %
Neutro Abs: 2.7 K/uL (ref 1.7–7.7)
Neutrophils Relative %: 42 %
Platelet Count: 161 K/uL (ref 150–400)
RBC: 5.03 MIL/uL (ref 4.22–5.81)
RDW: 13.1 % (ref 11.5–15.5)
WBC Count: 6.5 K/uL (ref 4.0–10.5)
nRBC: 0 % (ref 0.0–0.2)

## 2024-10-23 ENCOUNTER — Ambulatory Visit: Admitting: Dermatology

## 2025-01-01 ENCOUNTER — Inpatient Hospital Stay

## 2025-01-01 ENCOUNTER — Inpatient Hospital Stay: Admitting: Hematology and Oncology

## 2025-03-13 ENCOUNTER — Ambulatory Visit: Payer: Self-pay | Admitting: Nurse Practitioner
